# Patient Record
Sex: Female | Born: 1955 | Hispanic: Yes | Marital: Single | Smoking: Never smoker
Health system: Southern US, Community
[De-identification: ages and names within clinical notes are randomized; demographics above are authoritative.]

## PROBLEM LIST (undated history)

## (undated) DIAGNOSIS — Z789 Other specified health status: Secondary | ICD-10-CM

## (undated) DIAGNOSIS — R519 Headache, unspecified: Secondary | ICD-10-CM

---

## 2015-01-18 ENCOUNTER — Ambulatory Visit
Admission: RE | Admit: 2015-01-18 | Discharge: 2015-01-18 | Disposition: A | Discharge status: Home / Self Care | Payer: Self-pay | Source: Ambulatory Visit | Attending: Family Medicine | Admitting: Family Medicine

## 2015-01-18 ENCOUNTER — Other Ambulatory Visit: Payer: Self-pay | Admitting: Family Medicine

## 2015-01-18 DIAGNOSIS — M25561 Pain in right knee: Secondary | ICD-10-CM | POA: Insufficient documentation

## 2015-01-18 DIAGNOSIS — M25861 Other specified joint disorders, right knee: Secondary | ICD-10-CM | POA: Insufficient documentation

## 2015-01-18 DIAGNOSIS — M25461 Effusion, right knee: Secondary | ICD-10-CM | POA: Insufficient documentation

## 2020-08-01 ENCOUNTER — Other Ambulatory Visit: Payer: Self-pay | Admitting: Primary Care

## 2020-08-01 DIAGNOSIS — Z1231 Encounter for screening mammogram for malignant neoplasm of breast: Secondary | ICD-10-CM

## 2020-09-05 ENCOUNTER — Other Ambulatory Visit: Payer: Self-pay

## 2020-09-05 ENCOUNTER — Ambulatory Visit (INDEPENDENT_AMBULATORY_CARE_PROVIDER_SITE_OTHER): Payer: Self-pay | Admitting: Gastroenterology

## 2020-09-05 DIAGNOSIS — Z1211 Encounter for screening for malignant neoplasm of colon: Secondary | ICD-10-CM

## 2020-09-05 MED ORDER — NA SULFATE-K SULFATE-MG SULF 17.5-3.13-1.6 GM/177ML PO SOLN: 354.0000 mL | Freq: Once | ORAL | 0 refills | Status: AC

## 2020-09-05 NOTE — Progress Notes (Signed)
Gastroenterology Pre-Procedure Review  Request Date: 10/08/2020 Requesting Physician: Dr. Allegra Lai  PATIENT REVIEW QUESTIONS: The patient responded to the following health history questions as indicated:    1. Are you having any GI issues? no 2. Do you have a personal history of Polyps? no 3. Do you have a family history of Colon Cancer or Polyps? no 4. Diabetes Mellitus? no 5. Joint replacements in the past 12 months?no 6. Major health problems in the past 3 months?no 7. Any artificial heart valves, MVP, or defibrillator?no    MEDICATIONS & ALLERGIES:    Patient reports the following regarding taking any anticoagulation/antiplatelet therapy:   Plavix, Coumadin, Eliquis, Xarelto, Lovenox, Pradaxa, Brilinta, or Effient? no Aspirin? no  Patient confirms/reports the following medications:  Current Outpatient Medications  Medication Sig Dispense Refill  . Na Sulfate-K Sulfate-Mg Sulf 17.5-3.13-1.6 GM/177ML SOLN Take 354 mLs by mouth once for 1 dose. 354 mL 0   No current facility-administered medications for this visit.    Patient confirms/reports the following allergies:  Allergies  Allergen Reactions  . Penicillin G Itching    No orders of the defined types were placed in this encounter.   AUTHORIZATION INFORMATION Primary Insurance: 1D#: Group #:  Secondary Insurance: 1D#: Group #:  SCHEDULE INFORMATION: Date:  Time: Location:

## 2020-10-04 ENCOUNTER — Other Ambulatory Visit: Payer: Self-pay

## 2020-10-04 ENCOUNTER — Other Ambulatory Visit
Admission: RE | Admit: 2020-10-04 | Discharge: 2020-10-04 | Disposition: A | Discharge status: Home / Self Care | Payer: Medicare Other | Source: Ambulatory Visit | Attending: Gastroenterology | Admitting: Gastroenterology

## 2020-10-04 DIAGNOSIS — Z01812 Encounter for preprocedural laboratory examination: Secondary | ICD-10-CM | POA: Diagnosis present

## 2020-10-04 DIAGNOSIS — Z20822 Contact with and (suspected) exposure to covid-19: Secondary | ICD-10-CM | POA: Insufficient documentation

## 2020-10-05 ENCOUNTER — Encounter: Payer: Self-pay | Admitting: Gastroenterology

## 2020-10-05 LAB — SARS CORONAVIRUS 2 (TAT 6-24 HRS): SARS Coronavirus 2: NEGATIVE

## 2020-10-08 ENCOUNTER — Encounter
Admission: RE | Disposition: A | Discharge status: Home / Self Care | Payer: Self-pay | Source: Home / Self Care | Attending: Gastroenterology

## 2020-10-08 ENCOUNTER — Ambulatory Visit: Payer: Medicare Other | Admitting: Anesthesiology

## 2020-10-08 ENCOUNTER — Ambulatory Visit
Admission: RE | Admit: 2020-10-08 | Discharge: 2020-10-08 | Disposition: A | Discharge status: Home / Self Care | Payer: Medicare Other | Attending: Gastroenterology | Admitting: Gastroenterology

## 2020-10-08 ENCOUNTER — Encounter: Payer: Self-pay | Admitting: Gastroenterology

## 2020-10-08 ENCOUNTER — Other Ambulatory Visit: Payer: Self-pay

## 2020-10-08 DIAGNOSIS — K5730 Diverticulosis of large intestine without perforation or abscess without bleeding: Secondary | ICD-10-CM | POA: Diagnosis not present

## 2020-10-08 DIAGNOSIS — K621 Rectal polyp: Secondary | ICD-10-CM | POA: Insufficient documentation

## 2020-10-08 DIAGNOSIS — Z880 Allergy status to penicillin: Secondary | ICD-10-CM | POA: Diagnosis not present

## 2020-10-08 DIAGNOSIS — K6282 Dysplasia of anus: Secondary | ICD-10-CM | POA: Diagnosis not present

## 2020-10-08 DIAGNOSIS — Z1211 Encounter for screening for malignant neoplasm of colon: Secondary | ICD-10-CM

## 2020-10-08 DIAGNOSIS — K6289 Other specified diseases of anus and rectum: Secondary | ICD-10-CM | POA: Diagnosis not present

## 2020-10-08 HISTORY — PX: COLONOSCOPY WITH PROPOFOL: SHX5780

## 2020-10-08 HISTORY — DX: Other specified health status: Z78.9

## 2020-10-08 SURGERY — COLONOSCOPY WITH PROPOFOL
Anesthesia: General

## 2020-10-08 MED ORDER — LIDOCAINE HCL (PF) 2 % IJ SOLN
INTRAMUSCULAR | Status: AC
  Filled 2020-10-08: qty 5

## 2020-10-08 MED ORDER — PROPOFOL 10 MG/ML IV BOLUS
INTRAVENOUS | Status: DC | PRN
  Administered 2020-10-08: 60 mg via INTRAVENOUS

## 2020-10-08 MED ORDER — PROPOFOL 500 MG/50ML IV EMUL
INTRAVENOUS | Status: AC
  Filled 2020-10-08: qty 50

## 2020-10-08 MED ORDER — LIDOCAINE HCL (CARDIAC) PF 100 MG/5ML IV SOSY
PREFILLED_SYRINGE | INTRAVENOUS | Status: DC | PRN
  Administered 2020-10-08: 50 mg via INTRAVENOUS

## 2020-10-08 MED ORDER — SODIUM CHLORIDE 0.9 % IV SOLN: INTRAVENOUS | Status: DC

## 2020-10-08 MED ORDER — PROPOFOL 500 MG/50ML IV EMUL
INTRAVENOUS | Status: DC | PRN
  Administered 2020-10-08: 200 ug/kg/min via INTRAVENOUS

## 2020-10-08 NOTE — Progress Notes (Signed)
No risk

## 2020-10-08 NOTE — Anesthesia Postprocedure Evaluation (Signed)
Anesthesia Post Note  Patient: Mackenzie Bell  Procedure(s) Performed: COLONOSCOPY WITH PROPOFOL (N/A )  Patient location during evaluation: Endoscopy Anesthesia Type: General Level of consciousness: awake and alert Pain management: pain level controlled Vital Signs Assessment: post-procedure vital signs reviewed and stable Respiratory status: spontaneous breathing, nonlabored ventilation, respiratory function stable and patient connected to nasal cannula oxygen Cardiovascular status: blood pressure returned to baseline and stable Postop Assessment: no apparent nausea or vomiting Anesthetic complications: no   No complications documented.   Last Vitals:  Vitals:   10/08/20 0932 10/08/20 0940  BP: 117/66 116/74  Pulse: 64 60  Resp: 15 15  Temp:    SpO2: 99% 99%    Last Pain:  Vitals:   10/08/20 0940  TempSrc:   PainSc: 0-No pain                 Corinda Gubler

## 2020-10-08 NOTE — Anesthesia Preprocedure Evaluation (Signed)
Anesthesia Evaluation  Patient identified by MRN, date of birth, ID band Patient awake    Reviewed: Allergy & Precautions, NPO status , Patient's Chart, lab work & pertinent test results  History of Anesthesia Complications Negative for: history of anesthetic complications  Airway Mallampati: II  TM Distance: >3 FB Neck ROM: Full    Dental no notable dental hx. (+) Teeth Intact   Pulmonary neg pulmonary ROS, neg sleep apnea, neg COPD, Patient abstained from smoking.Not current smoker,    Pulmonary exam normal breath sounds clear to auscultation       Cardiovascular Exercise Tolerance: Good METS(-) hypertension(-) CAD and (-) Past MI negative cardio ROS  (-) dysrhythmias  Rhythm:Regular Rate:Normal - Systolic murmurs    Neuro/Psych negative neurological ROS  negative psych ROS   GI/Hepatic neg GERD  ,(+)     (-) substance abuse  ,   Endo/Other  neg diabetes  Renal/GU negative Renal ROS     Musculoskeletal   Abdominal   Peds  Hematology   Anesthesia Other Findings Past Medical History: No date: Medical history non-contributory  Reproductive/Obstetrics                             Anesthesia Physical Anesthesia Plan  ASA: II  Anesthesia Plan: General   Post-op Pain Management:    Induction: Intravenous  PONV Risk Score and Plan: 3 and Ondansetron, Propofol infusion and TIVA  Airway Management Planned: Nasal Cannula  Additional Equipment: None  Intra-op Plan:   Post-operative Plan:   Informed Consent: I have reviewed the patients History and Physical, chart, labs and discussed the procedure including the risks, benefits and alternatives for the proposed anesthesia with the patient or authorized representative who has indicated his/her understanding and acceptance.     Dental advisory given and Interpreter used for interveiw (Engineering geologist at bedside)  Plan  Discussed with: CRNA and Surgeon  Anesthesia Plan Comments: (Discussed risks of anesthesia with patient, including possibility of difficulty with spontaneous ventilation under anesthesia necessitating airway intervention, PONV, and rare risks such as cardiac or respiratory or neurological events. Patient understands.)        Anesthesia Quick Evaluation

## 2020-10-08 NOTE — OR Nursing (Signed)
Marittiza here to interpret spanish for pt.

## 2020-10-08 NOTE — Op Note (Signed)
[facility] Gastroenterology Patient Name: Mackenzie Bell Procedure Date: 10/08/2020 8:41 AM MRN: [id number] Account #: [id number] Date of Birth: Apr 11, 1956 Admit Type: Outpatient Age: 65 Room: [facility] ENDO ROOM 2 Gender: Female Note Status: Finalized Procedure:             Colonoscopy Indications:           Screening for colorectal malignant neoplasm, This is                         the patient's first colonoscopy Providers:             Toney Reil MD, MD Referring MD:          Dorcas Mcmurray (Referring MD) Medicines:             General Anesthesia Complications:         No immediate complications. Estimated blood loss:                         Minimal. Procedure:             Pre-Anesthesia Assessment:                        - Prior to the procedure, a History and Physical was                         performed, and patient medications and allergies were                         reviewed. The patient is competent. The risks and                         benefits of the procedure and the sedation options and                         risks were discussed with the patient. All questions                         were answered and informed consent was obtained.                         Patient identification and proposed procedure were                         verified by the physician, the nurse, the                         anesthesiologist, the anesthetist and the technician                         in the pre-procedure area in the procedure room in the                         endoscopy suite. Mental Status Examination: alert and                         oriented. Airway Examination: normal oropharyngeal  airway and neck mobility. Respiratory Examination:                         clear to auscultation. CV Examination: normal.                         Prophylactic Antibiotics: The patient does not require                         prophylactic  antibiotics. Prior Anticoagulants: The                         patient has taken no previous anticoagulant or                         antiplatelet agents. ASA Grade Assessment: II - A                         patient with mild systemic disease. After reviewing                         the risks and benefits, the patient was deemed in                         satisfactory condition to undergo the procedure. The                         anesthesia plan was to use general anesthesia.                         Immediately prior to administration of medications,                         the patient was re-assessed for adequacy to receive                         sedatives. The heart rate, respiratory rate, oxygen                         saturations, blood pressure, adequacy of pulmonary                         ventilation, and response to care were monitored                         throughout the procedure. The physical status of the                         patient was re-assessed after the procedure.                        After obtaining informed consent, the colonoscope was                         passed under direct vision. Throughout the procedure,                         the patient's blood pressure, pulse, and oxygen  saturations were monitored continuously. The                         Colonoscope was introduced through the anus and                         advanced to the the terminal ileum, with                         identification of the appendiceal orifice and IC                         valve. The colonoscopy was performed without                         difficulty. The patient tolerated the procedure well.                         The quality of the bowel preparation was evaluated                         using the BBPS [facility] Bowel Preparation Scale) with                         scores of: Right Colon = 3, Transverse Colon = 3 and                         Left Colon =  3 (entire mucosa seen well with no                         residual staining, small fragments of stool or opaque                         liquid). The total BBPS score equals 9. Findings:      The perianal and digital rectal examinations were normal. Pertinent       negatives include normal sphincter tone and no palpable rectal lesions.      The terminal ileum appeared normal.      A few diverticula were found in the sigmoid colon.      A diminutive polyp was found in the distal rectum. The polyp was       sessile. The polyp was removed with a cold snare. Resection and       retrieval were complete.      A localized area of granular mucosa was found in the anal canal abutting       the dentate line. Biopsies were taken with a cold forceps for histology.       Estimated blood loss was minimal. Impression:            - The examined portion of the ileum was normal.                        - Diverticulosis in the sigmoid colon.                        - One diminutive polyp in the distal rectum, removed  with a cold snare. Resected and retrieved.                        - Granularity at the anus. Biopsied. Recommendation:        - Discharge patient to home (with escort).                        - Resume previous diet today.                        - Continue present medications.                        - Await pathology results.                        - Repeat colonoscopy in 7 years for surveillance based                         on pathology results. Procedure Code(s):     --- Professional ---                        [phone], Colonoscopy, flexible; with removal of                         tumor(s), polyp(s), or other lesion(s) by snare                         technique                        45380, 59, Colonoscopy, flexible; with biopsy, single                         or multiple Diagnosis Code(s):     --- Professional ---                        Z12.11, Encounter for screening  for malignant neoplasm                         of colon                        K62.1, Rectal polyp                        K62.89, Other specified diseases of anus and rectum                        K57.30, Diverticulosis of large intestine without                         perforation or abscess without bleeding CPT copyright 2019 American Medical Association. All rights reserved. The codes documented in this report are preliminary and upon coder review may  be revised to meet current compliance requirements. Dr. Libby Maw Toney Reil MD, MD 10/08/2020 9:12:06 AM This report has been signed electronically. Number of Addenda: 0 Note Initiated On: 10/08/2020 8:41 AM Scope Withdrawal Time: 0 hours 10 minutes 55 seconds  Total Procedure Duration: 0 hours 13 minutes 38 seconds  Estimated Blood Loss:  Estimated blood loss was minimal.      [facility]

## 2020-10-08 NOTE — Transfer of Care (Signed)
Immediate Anesthesia Transfer of Care Note  Patient: Mackenzie Bell  Procedure(s) Performed: COLONOSCOPY WITH PROPOFOL (N/A )  Patient Location: PACU and Endoscopy Unit  Anesthesia Type:General  Level of Consciousness: drowsy and patient cooperative  Airway & Oxygen Therapy: Patient Spontanous Breathing  Post-op Assessment: Report given to RN  Post vital signs: Reviewed and stable  Last Vitals:  Vitals Value Taken Time  BP 93/51 10/08/20 0912  Temp 35.9 C 10/08/20 0912  Pulse 76 10/08/20 0912  Resp 12 10/08/20 0912  SpO2 93 % 10/08/20 0912    Last Pain:  Vitals:   10/08/20 0912  TempSrc: Temporal  PainSc: Asleep         Complications: No complications documented.

## 2020-10-08 NOTE — H&P (Signed)
  Mackenzie Repress, MD [address]  [address]  [place], [place] [address]  Main: [phone]  Fax: [phone] Pager: [phone]  Primary Care Physician:  Sandrea Hughs, NP Primary Gastroenterologist:  Dr. Arlyss Bell  Pre-Procedure History & Physical: HPI:  Mackenzie Bell is a 65 y.o. female is here for an colonoscopy.   Past Medical History:  Diagnosis Date  . Medical history non-contributory     History reviewed. No pertinent surgical history.  Prior to Admission medications   Not on File    Allergies as of 09/05/2020 - never reviewed  Allergen Reaction Noted  . Penicillin g Itching 07/21/2018    History reviewed. No pertinent family history.  Social History   Socioeconomic History  . Marital status: Single    Spouse name: Not on file  . Number of children: Not on file  . Years of education: Not on file  . Highest education level: Not on file  Occupational History  . Not on file  Tobacco Use  . Smoking status: Never Smoker  . Smokeless tobacco: Never Used  Substance and Sexual Activity  . Alcohol use: Not on file    Comment: occasional beer  . Drug use: Never  . Sexual activity: Not on file  Other Topics Concern  . Not on file  Social History Narrative  . Not on file   Social Determinants of Health   Financial Resource Strain: Not on file  Food Insecurity: Not on file  Transportation Needs: Not on file  Physical Activity: Not on file  Stress: Not on file  Social Connections: Not on file  Intimate Partner Violence: Not on file    Review of Systems: See HPI, otherwise negative ROS  Physical Exam: Vitals:   10/08/20 0816  BP: 127/67  Pulse: 66  Resp: 18  Temp: (!) 97.1 F (36.2 C)  SpO2: 99%    General:   Alert,  pleasant and cooperative in NAD Head:  Normocephalic and atraumatic. Neck:  Supple; no masses or thyromegaly. Lungs:  Clear throughout to auscultation.    Heart:  Regular rate and rhythm. Abdomen:  Soft,  nontender and nondistended. Normal bowel sounds, without guarding, and without rebound.   Neurologic:  Alert and  oriented x4;  grossly normal neurologically.  Impression/Plan: Mackenzie Bell is here for an colonoscopy to be performed for colon cancer screening  Risks, benefits, limitations, and alternatives regarding  colonoscopy have been reviewed with the patient.  Questions have been answered.  All parties agreeable.   Lannette Donath, MD  10/08/2020, 8:37 AM

## 2020-10-09 ENCOUNTER — Encounter: Payer: Self-pay | Admitting: Gastroenterology

## 2020-10-09 LAB — SURGICAL PATHOLOGY

## 2020-10-15 ENCOUNTER — Telehealth: Payer: Self-pay

## 2020-10-15 NOTE — Telephone Encounter (Signed)
-----   Message from Toney Reil, MD sent at 10/15/2020  3:24 PM EDT ----- Called patient to discuss about the abnormal pathology results of the anal canal lesion and I reviewed case with colorectal surgeon, Dr. Marin Olp.  Explained the results to the patient via Spanish interpreter service over telephone and she agreed to be referred to the colorectal surgery group in [place], [place] [facility]  Morrie Sheldon Please put in a referral to [place] [facility] Dx: HGSIL, anal canal lesion   Mackenzie Bell

## 2020-10-15 NOTE — Progress Notes (Signed)
Called patient to discuss about the abnormal pathology results of the anal canal lesion and I reviewed case with colorectal surgeon, Dr. Marin Olp.  Explained the results to the patient via Spanish interpreter service over telephone and she agreed to be referred to the colorectal surgery group in [place], [place] [facility]  Morrie Sheldon Please put in a referral to [place] [facility] Dx: HGSIL, anal canal lesion   Mackenzie Bell

## 2020-10-15 NOTE — Telephone Encounter (Signed)
Faxed referral to [place]. Attached snap shot, insurance and notes, labs .

## 2020-12-03 ENCOUNTER — Ambulatory Visit: Payer: Self-pay | Admitting: General Surgery

## 2021-02-07 ENCOUNTER — Encounter (HOSPITAL_BASED_OUTPATIENT_CLINIC_OR_DEPARTMENT_OTHER): Payer: Self-pay | Admitting: General Surgery

## 2021-02-07 DIAGNOSIS — K6282 Dysplasia of anus: Secondary | ICD-10-CM

## 2021-02-07 HISTORY — DX: Dysplasia of anus: K62.82

## 2021-02-11 ENCOUNTER — Encounter (HOSPITAL_BASED_OUTPATIENT_CLINIC_OR_DEPARTMENT_OTHER): Payer: Self-pay | Admitting: General Surgery

## 2021-02-11 ENCOUNTER — Other Ambulatory Visit: Payer: Self-pay

## 2021-02-11 NOTE — Progress Notes (Signed)
Spoke w/ via phone for pre-op interview---pt with pacific interpreter johair number [phone] Lab needs dos----  none            Lab results------none COVID test -----patient states asymptomatic no test needed Arrive at -------530 am 02-14-2021 NPO after MN NO Solid Food or liquids Med rec completed Medications to take morning of surgery -----none Diabetic medication -----n/a Patient instructed no nail polish to be worn day of surgery Patient instructed to bring photo id and insurance card day of surgery Patient aware to have Driver (ride ) / caregiver  daughter Neita Goodnight mingo to stay  for 24 hours after surgery  Patient Special Instructions -----none Pre-Op special Istructions -----none Patient verbalized understanding of instructions that were given at this phone interview. Patient denies shortness of breath, chest pain, fever, cough at this phone interview.   Spanish interpreter requested, no gender preference per patient, email requested placed on patient chart.

## 2021-02-13 NOTE — Anesthesia Preprocedure Evaluation (Addendum)
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  Reproductive/Obstetrics                            Anesthesia Physical Anesthesia Plan  ASA: 2  Anesthesia Plan: MAC   Post-op Pain Management:    Induction:   PONV Risk Score and Plan: 2 and Treatment may vary due to age or medical condition, Midazolam, Ondansetron, Propofol infusion and TIVA  Airway Management Planned: Natural Airway and Simple Face Mask  Additional Equipment: None  Intra-op Plan:   Post-operative Plan:   Informed Consent: I have reviewed the patients History and Physical, chart, labs and discussed the procedure including the risks, benefits and alternatives for the proposed anesthesia with the patient or authorized representative who has indicated his/her understanding and acceptance.       Plan Discussed with: CRNA  Anesthesia Plan Comments:        Anesthesia Quick Evaluation

## 2021-02-14 ENCOUNTER — Other Ambulatory Visit: Payer: Self-pay

## 2021-02-14 ENCOUNTER — Ambulatory Visit (HOSPITAL_BASED_OUTPATIENT_CLINIC_OR_DEPARTMENT_OTHER): Payer: Medicare HMO | Admitting: Anesthesiology

## 2021-02-14 ENCOUNTER — Encounter (HOSPITAL_BASED_OUTPATIENT_CLINIC_OR_DEPARTMENT_OTHER)
Admission: RE | Disposition: A | Discharge status: Home / Self Care | Payer: Self-pay | Source: Home / Self Care | Attending: General Surgery

## 2021-02-14 ENCOUNTER — Encounter (HOSPITAL_BASED_OUTPATIENT_CLINIC_OR_DEPARTMENT_OTHER): Payer: Self-pay | Admitting: General Surgery

## 2021-02-14 ENCOUNTER — Ambulatory Visit (HOSPITAL_BASED_OUTPATIENT_CLINIC_OR_DEPARTMENT_OTHER)
Admission: RE | Admit: 2021-02-14 | Discharge: 2021-02-14 | Disposition: A | Discharge status: Home / Self Care | Payer: Medicare HMO | Attending: General Surgery | Admitting: General Surgery

## 2021-02-14 DIAGNOSIS — A630 Anogenital (venereal) warts: Secondary | ICD-10-CM | POA: Diagnosis not present

## 2021-02-14 DIAGNOSIS — K6282 Dysplasia of anus: Secondary | ICD-10-CM | POA: Insufficient documentation

## 2021-02-14 HISTORY — PX: RECTAL EXAM UNDER ANESTHESIA: SHX6399

## 2021-02-14 HISTORY — PX: MASS EXCISION: SHX2000

## 2021-02-14 HISTORY — DX: Headache, unspecified: R51.9

## 2021-02-14 SURGERY — EXCISION MASS
Anesthesia: Monitor Anesthesia Care | Site: Rectum

## 2021-02-14 MED ORDER — SODIUM CHLORIDE 0.9 % IV SOLN: 250.0000 mL | INTRAVENOUS | Status: DC | PRN

## 2021-02-14 MED ORDER — DEXAMETHASONE SODIUM PHOSPHATE 4 MG/ML IJ SOLN
INTRAMUSCULAR | Status: DC | PRN
Start: 2021-02-14 — End: 2021-02-14
  Administered 2021-02-14: 5 mg via INTRAVENOUS

## 2021-02-14 MED ORDER — SODIUM CHLORIDE 0.9% FLUSH: 3.0000 mL | INTRAVENOUS | Status: DC | PRN

## 2021-02-14 MED ORDER — PROPOFOL 1000 MG/100ML IV EMUL
INTRAVENOUS | Status: AC
  Filled 2021-02-14: qty 100

## 2021-02-14 MED ORDER — FENTANYL CITRATE (PF) 100 MCG/2ML IJ SOLN: 25.0000 ug | INTRAMUSCULAR | Status: DC | PRN

## 2021-02-14 MED ORDER — SODIUM CHLORIDE 0.9% FLUSH: 3.0000 mL | Freq: Two times a day (BID) | INTRAVENOUS | Status: DC

## 2021-02-14 MED ORDER — 0.9 % SODIUM CHLORIDE (POUR BTL) OPTIME
TOPICAL | Status: DC | PRN
  Administered 2021-02-14: 500 mL

## 2021-02-14 MED ORDER — ONDANSETRON HCL 4 MG/2ML IJ SOLN
INTRAMUSCULAR | Status: AC
  Filled 2021-02-14: qty 2

## 2021-02-14 MED ORDER — MIDAZOLAM HCL 5 MG/5ML IJ SOLN
INTRAMUSCULAR | Status: DC | PRN
  Administered 2021-02-14: 1 mg via INTRAVENOUS

## 2021-02-14 MED ORDER — LACTATED RINGERS IV SOLN: INTRAVENOUS | Status: DC

## 2021-02-14 MED ORDER — ACETAMINOPHEN 500 MG PO TABS
ORAL_TABLET | ORAL | Status: AC
  Filled 2021-02-14: qty 2

## 2021-02-14 MED ORDER — TRAMADOL HCL 50 MG PO TABS: 50.0000 mg | ORAL_TABLET | Freq: Four times a day (QID) | ORAL | 0 refills | Status: DC | PRN

## 2021-02-14 MED ORDER — PROPOFOL 1000 MG/100ML IV EMUL
INTRAVENOUS | Status: AC
  Filled 2021-02-14: qty 200

## 2021-02-14 MED ORDER — KETOROLAC TROMETHAMINE 30 MG/ML IJ SOLN
INTRAMUSCULAR | Status: AC
  Filled 2021-02-14: qty 1

## 2021-02-14 MED ORDER — ACETAMINOPHEN 325 MG PO TABS: 650.0000 mg | ORAL_TABLET | ORAL | Status: DC | PRN

## 2021-02-14 MED ORDER — BUPIVACAINE-EPINEPHRINE 0.5% -1:200000 IJ SOLN
INTRAMUSCULAR | Status: DC | PRN
  Administered 2021-02-14: 30 mL

## 2021-02-14 MED ORDER — PROPOFOL 500 MG/50ML IV EMUL
INTRAVENOUS | Status: DC | PRN
  Administered 2021-02-14: 100 ug/kg/min via INTRAVENOUS

## 2021-02-14 MED ORDER — KETOROLAC TROMETHAMINE 30 MG/ML IJ SOLN
INTRAMUSCULAR | Status: DC | PRN
  Administered 2021-02-14: 30 mg via INTRAVENOUS

## 2021-02-14 MED ORDER — FENTANYL CITRATE (PF) 100 MCG/2ML IJ SOLN
INTRAMUSCULAR | Status: AC
  Filled 2021-02-14: qty 2

## 2021-02-14 MED ORDER — ACETAMINOPHEN 500 MG PO TABS
1000.0000 mg | ORAL_TABLET | Freq: Once | ORAL | Status: AC
  Administered 2021-02-14: 1000 mg via ORAL

## 2021-02-14 MED ORDER — LIDOCAINE HCL (CARDIAC) PF 100 MG/5ML IV SOSY
PREFILLED_SYRINGE | INTRAVENOUS | Status: DC | PRN
  Administered 2021-02-14: 40 mg via INTRAVENOUS

## 2021-02-14 MED ORDER — OXYCODONE HCL 5 MG PO TABS: 5.0000 mg | ORAL_TABLET | Freq: Once | ORAL | Status: DC | PRN

## 2021-02-14 MED ORDER — ACETAMINOPHEN 325 MG RE SUPP: 650.0000 mg | RECTAL | Status: DC | PRN

## 2021-02-14 MED ORDER — MIDAZOLAM HCL 2 MG/2ML IJ SOLN
INTRAMUSCULAR | Status: AC
  Filled 2021-02-14: qty 2

## 2021-02-14 MED ORDER — OXYCODONE HCL 5 MG PO TABS: 5.0000 mg | ORAL_TABLET | ORAL | Status: DC | PRN

## 2021-02-14 MED ORDER — PROPOFOL 500 MG/50ML IV EMUL
INTRAVENOUS | Status: AC
  Filled 2021-02-14: qty 50

## 2021-02-14 MED ORDER — ONDANSETRON HCL 4 MG/2ML IJ SOLN
INTRAMUSCULAR | Status: DC | PRN
  Administered 2021-02-14: 4 mg via INTRAVENOUS

## 2021-02-14 MED ORDER — OXYCODONE HCL 5 MG/5ML PO SOLN: 5.0000 mg | Freq: Once | ORAL | Status: DC | PRN

## 2021-02-14 MED ORDER — LIDOCAINE HCL (PF) 2 % IJ SOLN
INTRAMUSCULAR | Status: AC
  Filled 2021-02-14: qty 5

## 2021-02-14 MED ORDER — DEXAMETHASONE SODIUM PHOSPHATE 10 MG/ML IJ SOLN
INTRAMUSCULAR | Status: AC
  Filled 2021-02-14: qty 1

## 2021-02-14 SURGICAL SUPPLY — 56 items
APL SKNCLS STERI-STRIP NONHPOA (GAUZE/BANDAGES/DRESSINGS) ×2
BENZOIN TINCTURE PRP APPL 2/3 (GAUZE/BANDAGES/DRESSINGS) ×4 IMPLANT
BLADE EXTENDED COATED 6.5IN (ELECTRODE) IMPLANT
BLADE HEX COATED 2.75 (ELECTRODE) ×2 IMPLANT
BLADE SURG 10 STRL SS (BLADE) ×2 IMPLANT
COVER BACK TABLE 60X90IN (DRAPES) ×2 IMPLANT
COVER MAYO STAND STRL (DRAPES) ×2 IMPLANT
DECANTER SPIKE VIAL GLASS SM (MISCELLANEOUS) ×2 IMPLANT
DRAPE HYSTEROSCOPY (MISCELLANEOUS) IMPLANT
DRAPE LAPAROTOMY 100X72 PEDS (DRAPES) ×2 IMPLANT
DRAPE SHEET LG 3/4 BI-LAMINATE (DRAPES) IMPLANT
DRAPE UTILITY XL STRL (DRAPES) ×2 IMPLANT
DRSG PAD ABDOMINAL 8X10 ST (GAUZE/BANDAGES/DRESSINGS) ×2 IMPLANT
ELECT REM PT RETURN 9FT ADLT (ELECTROSURGICAL) ×2
ELECTRODE REM PT RTRN 9FT ADLT (ELECTROSURGICAL) ×1 IMPLANT
GAUZE 4X4 16PLY ~~LOC~~+RFID DBL (SPONGE) ×2 IMPLANT
GAUZE SPONGE 4X4 12PLY STRL (GAUZE/BANDAGES/DRESSINGS) ×2 IMPLANT
GAUZE SPONGE 4X4 12PLY STRL LF (GAUZE/BANDAGES/DRESSINGS) ×2 IMPLANT
GLOVE SURG ENC MOIS LTX SZ6.5 (GLOVE) ×2 IMPLANT
GLOVE SURG UNDER POLY LF SZ7 (GLOVE) ×2 IMPLANT
GOWN STRL REUS W/TWL XL LVL3 (GOWN DISPOSABLE) ×2 IMPLANT
HYDROGEN PEROXIDE 16OZ (MISCELLANEOUS) ×2 IMPLANT
IV CATH 14GX2 1/4 (CATHETERS) ×2 IMPLANT
IV CATH 18G SAFETY (IV SOLUTION) ×2 IMPLANT
KIT SIGMOIDOSCOPE (SET/KITS/TRAYS/PACK) IMPLANT
KIT TURNOVER CYSTO (KITS) ×2 IMPLANT
LEGGING LITHOTOMY PAIR STRL (DRAPES) IMPLANT
LOOP VESSEL MAXI BLUE (MISCELLANEOUS) IMPLANT
NDL SAFETY ECLIPSE 18X1.5 (NEEDLE) IMPLANT
NEEDLE HYPO 18GX1.5 SHARP (NEEDLE)
NEEDLE HYPO 22GX1.5 SAFETY (NEEDLE) ×2 IMPLANT
NS IRRIG 500ML POUR BTL (IV SOLUTION) ×2 IMPLANT
PACK BASIN DAY SURGERY FS (CUSTOM PROCEDURE TRAY) ×2 IMPLANT
PAD ARMBOARD 7.5X6 YLW CONV (MISCELLANEOUS) IMPLANT
PANTS MESH DISP LRG (UNDERPADS AND DIAPERS) ×1 IMPLANT
PANTS MESH DISPOSABLE L (UNDERPADS AND DIAPERS) ×1
PENCIL SMOKE EVACUATOR (MISCELLANEOUS) ×2 IMPLANT
SPONGE HEMORRHOID 8X3CM (HEMOSTASIS) IMPLANT
SPONGE SURGIFOAM ABS GEL 12-7 (HEMOSTASIS) IMPLANT
SUCTION FRAZIER HANDLE 10FR (MISCELLANEOUS)
SUCTION TUBE FRAZIER 10FR DISP (MISCELLANEOUS) IMPLANT
SUT CHROMIC 2 0 SH (SUTURE) ×2 IMPLANT
SUT CHROMIC 3 0 SH 27 (SUTURE) ×2 IMPLANT
SUT ETHIBOND 0 (SUTURE) IMPLANT
SUT MON AB 3-0 SH 27 (SUTURE) ×2
SUT MON AB 3-0 SH27 (SUTURE) ×1 IMPLANT
SUT VIC AB 2-0 SH 27 (SUTURE)
SUT VIC AB 2-0 SH 27XBRD (SUTURE) IMPLANT
SUT VIC AB 3-0 SH 18 (SUTURE) IMPLANT
SUT VIC AB 3-0 SH 27 (SUTURE)
SUT VIC AB 3-0 SH 27XBRD (SUTURE) IMPLANT
SYR CONTROL 10ML LL (SYRINGE) ×2 IMPLANT
TOWEL OR 17X26 10 PK STRL BLUE (TOWEL DISPOSABLE) ×2 IMPLANT
TRAY DSU PREP LF (CUSTOM PROCEDURE TRAY) ×2 IMPLANT
TUBE CONNECTING 12X1/4 (SUCTIONS) ×2 IMPLANT
YANKAUER SUCT BULB TIP NO VENT (SUCTIONS) ×2 IMPLANT

## 2021-02-14 NOTE — Anesthesia Postprocedure Evaluation (Signed)
Anesthesia Post Note  Patient: Mackenzie Bell  Procedure(s) Performed: EXCISIONAL BIOPSY OF ANAL CANAL LESION (Rectum) RECTAL EXAM UNDER ANESTHESIA (Rectum)     Patient location during evaluation: PACU Anesthesia Type: MAC Level of consciousness: awake and alert and oriented Pain management: pain level controlled Vital Signs Assessment: post-procedure vital signs reviewed and stable Respiratory status: spontaneous breathing, nonlabored ventilation and respiratory function stable Cardiovascular status: blood pressure returned to baseline Postop Assessment: no apparent nausea or vomiting Anesthetic complications: no   No notable events documented.  Last Vitals:  Vitals:   02/14/21 0812 02/14/21 0815  BP: (!) 144/71 (!) 144/72  Pulse: 86 90  Resp: 15 (!) 22  Temp: (!) 36.4 C   SpO2: 96% 95%    Last Pain:  Vitals:   02/14/21 0812  TempSrc:   PainSc: 0-No pain                 Marthenia Rolling

## 2021-02-14 NOTE — Discharge Instructions (Addendum)
Beginning the day after surgery:  You may sit in a tub of warm water 2-3 times a day to relieve discomfort.  Eat a regular diet high in fiber.  Avoid foods that give you constipation or diarrhea.  Avoid foods that are difficult to digest, such as seeds, nuts, corn or popcorn.  Do not go any longer than 2 days without a bowel movement.  You may take a dose of Milk of Magnesia if you become constipated.    Drink 6-8 glasses of water daily.  Walking is encouraged.  Avoid strenuous activity and heavy lifting for one month after surgery.    Call the office if you have any questions or concerns.  Call immediately if you develop:  Excessive rectal bleeding (more than a cup or passing large clots) Increased discomfort Fever greater than 100 F Difficulty urinating    Post Anesthesia Home Care Instructions  Activity: Get plenty of rest for the remainder of the day. A responsible individual must stay with you for 24 hours following the procedure.  For the next 24 hours, DO NOT: -Drive a car -Advertising copywriter -Drink alcoholic beverages -Take any medication unless instructed by your physician -Make any legal decisions or sign important papers.  Meals: Start with liquid foods such as gelatin or soup. Progress to regular foods as tolerated. Avoid greasy, spicy, heavy foods. If nausea and/or vomiting occur, drink only clear liquids until the nausea and/or vomiting subsides. Call your physician if vomiting continues.  Special Instructions/Symptoms: Your throat may feel dry or sore from the anesthesia or the breathing tube placed in your throat during surgery. If this causes discomfort, gargle with warm salt water. The discomfort should disappear within 24 hours.  No ibuprofen, Advil, Aleve, Motrin, ketorolac, meloxicam, or naproxen until after 2 pm today if needed.

## 2021-02-14 NOTE — Addendum Note (Signed)
Addendum  created 02/14/21 0906 by Jessica Priest, CRNA   Charge Capture section accepted

## 2021-02-14 NOTE — H&P (Addendum)
The patient is a 65 year old female who presents with anal lesions. 65 year old female who presents to the office for evaluation of an anal lesion seen on recent colonoscopy.  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  She is asymptomatic.  She speaks Spanish only, and the encounter was conducted through an interpreter.   Diagnostic Studies History (Charmella Little, CNA; 12/03/2020 10:14 AM) Colonoscopy   1-5 years ago Mammogram   1-3 years ago  Allergies (Charmella Little, CNA; 12/03/2020 10:15 AM) No Known Drug Allergies   [12/03/2020]: Allergies Reconciled    Medication History (Charmella Little, CNA; 12/03/2020 10:15 AM) Medications Reconciled   Social History (Charmella Little, CNA; 12/03/2020 10:14 AM) Caffeine use   Coffee. No alcohol use   No drug use   Tobacco use   Never smoker.  Family History (Charmella Little, CNA; 12/03/2020 10:14 AM) First Degree Relatives   No pertinent family history    Pregnancy / Birth History (Charmella Little, CNA; 12/03/2020 10:14 AM) Age at menarche   13 years. Age of menopause   83-50 Gravida   3 Length (months) of breastfeeding   12-24 Maternal age   32-20  Other Problems (Charmella Little, CNA; 12/03/2020 10:14 AM) No pertinent past medical history      Review of Systems General Not Present- Appetite Loss, Chills, Fatigue, Fever, Night Sweats, Weight Gain and Weight Loss. Skin Not Present- Change in Wart/Mole, Dryness, Hives, Jaundice, New Lesions, Non-Healing Wounds, Rash and Ulcer. HEENT Present- Wears glasses/contact lenses. Not Present- Earache, Hearing Loss, Hoarseness, Nose Bleed, Oral Ulcers, Ringing in the Ears, Seasonal Allergies, Sinus Pain, Sore Throat, Visual Disturbances and Yellow Eyes. Respiratory Not Present- Bloody sputum, Chronic Cough, Difficulty Breathing, Snoring and Wheezing. Cardiovascular Not Present- Chest Pain, Difficulty Breathing Lying Down, Leg Cramps, Palpitations, Rapid Heart Rate, Shortness of Breath and  Swelling of Extremities. Female Genitourinary Not Present- Frequency, Nocturia, Painful Urination, Pelvic Pain and Urgency. Musculoskeletal Not Present- Back Pain, Joint Pain, Joint Stiffness, Muscle Pain, Muscle Weakness and Swelling of Extremities. Neurological Not Present- Decreased Memory, Fainting, Headaches, Numbness, Seizures, Tingling, Tremor, Trouble walking and Weakness. Psychiatric Not Present- Anxiety, Bipolar, Change in Sleep Pattern, Depression, Fearful and Frequent crying. Hematology Not Present- Blood Thinners, Easy Bruising, Excessive bleeding, Gland problems, HIV and Persistent Infections.  BP (!) 152/68   Pulse 68   Temp 98.5 F (36.9 C) (Oral)   Resp 15   Ht 4\' 11"  (1.499 m)   Wt 78.6 kg   SpO2 99%   BMI 34.98 kg/m      Physical Exam  General Mental Status - Alert. General Appearance - Cooperative. CV:RRR Lungs: CTA Abd: soft Rectal Anorectal Exam External - normal external exam. Internal - normal sphincter tone.    Procedure   Other: Procedure: Anoscopy.... Marland KitchenSurgeon: Marland Kitchen....Maisie FusMarland KitchenAfter the risks and benefits were explained, verbal consent was obtained for above procedure.  A medical assistant chaperone was present thoroughout the entire procedure. 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 During this, a lesion was noted in the anal canal. 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 I have recommended exam under anesthesia with excision for complete biopsy and removal. We have discussed this in detail via an interpreter. All questions were answered. We discussed typical recovery time, post op pain and bleeding and need for additional follow-up, depending on pathology.

## 2021-02-14 NOTE — Transfer of Care (Signed)
Immediate Anesthesia Transfer of Care Note  Patient: Mackenzie Bell  Procedure(s) Performed: Procedure(s) (LRB): EXCISIONAL BIOPSY OF ANAL CANAL LESION (N/A) RECTAL EXAM UNDER ANESTHESIA (N/A)  Patient Location: PACU  Anesthesia Type: MAC  Level of Consciousness: awake, sedated, patient cooperative and responds to stimulation  Airway & Oxygen Therapy: Patient Spontanous Breathing and Patient connected to RA with soft FM   Post-op Assessment: Report given to PACU RN, Post -op Vital signs reviewed and stable and Patient moving all extremities  Post vital signs: Reviewed and stable  Complications: No apparent anesthesia complications

## 2021-02-14 NOTE — Op Note (Signed)
02/14/2021  8:05 AM  PATIENT:  Mackenzie Bell  65 y.o. female  Patient Care Team: Freddy Finner, NP as PCP - General (Nurse Practitioner)  PRE-OPERATIVE DIAGNOSIS:  ANAL CANAL AIN GRADE 2  POST-OPERATIVE DIAGNOSIS:  ANAL CANAL AIN GRADE 2  PROCEDURE:   EXCISIONAL BIOPSY OF ANAL CANAL LESION RECTAL EXAM UNDER ANESTHESIA    Surgeon(s): Leighton Ruff, MD  ASSISTANT: none   ANESTHESIA:   local and MAC  SPECIMEN:  Source of Specimen:  anterior anal canal mass  DISPOSITION OF SPECIMEN:  PATHOLOGY  COUNTS:  YES  PLAN OF CARE: Discharge to home after PACU  PATIENT DISPOSITION:  PACU - hemodynamically stable.  INDICATION: 65 y.o. F with anterior anal canal mass   OR FINDINGS: ~68m anal lesion proximal to dentate line  DESCRIPTION: the patient was identified in the preoperative holding area and taken to the OR where they were laid on the operating room table.  MAC anesthesia was induced without difficulty. The patient was then positioned in prone jackknife position with buttocks gently taped apart.  The patient was then prepped and draped in usual sterile fashion.  SCDs were noted to be in place prior to the initiation of anesthesia. A surgical timeout was performed indicating the correct patient, procedure, positioning and need for preoperative antibiotics.  A rectal block was performed using Marcaine with epinephrine.    I began with a digital rectal exam.  The mass was noted at anterior midline.  I then placed a Hill-Ferguson anoscope into the anal canal and evaluated this completely.  There were no other lesions noted.  I incised the mass with margins using scissors.  The anoderm was closed using a 3-0 Chromic suture.  Additional Marcaine was applied.  Hemostasis was good.  A dressing was applied and the patient was awakened from anesthesia and sent to the PACU in stable condition.  All counts were correct per OR staff.

## 2021-02-14 NOTE — Anesthesia Procedure Notes (Addendum)
Procedure Name: MAC Date/Time: 02/14/2021 7:38 AM Performed by: Justice Rocher, CRNA Pre-anesthesia Checklist: Timeout performed, Patient being monitored, Suction available, Emergency Drugs available and Patient identified Patient Re-evaluated:Patient Re-evaluated prior to induction Oxygen Delivery Method: Simple face mask Preoxygenation: Pre-oxygenation with 100% oxygen Induction Type: IV induction Placement Confirmation: positive ETCO2, CO2 detector and breath sounds checked- equal and bilateral

## 2021-02-15 ENCOUNTER — Encounter (HOSPITAL_BASED_OUTPATIENT_CLINIC_OR_DEPARTMENT_OTHER): Payer: Self-pay | Admitting: General Surgery

## 2021-02-19 LAB — SURGICAL PATHOLOGY

## 2021-07-09 ENCOUNTER — Ambulatory Visit: Payer: Medicare HMO | Admitting: Obstetrics and Gynecology

## 2021-07-09 ENCOUNTER — Other Ambulatory Visit: Payer: Self-pay

## 2021-07-09 ENCOUNTER — Encounter: Payer: Self-pay | Admitting: Obstetrics and Gynecology

## 2021-07-09 ENCOUNTER — Other Ambulatory Visit (HOSPITAL_COMMUNITY)
Admission: RE | Admit: 2021-07-09 | Discharge: 2021-07-09 | Disposition: A | Discharge status: Home / Self Care | Payer: Medicare HMO | Source: Ambulatory Visit | Attending: Obstetrics and Gynecology | Admitting: Obstetrics and Gynecology

## 2021-07-09 VITALS — BP 122/70 | Ht 61.0 in | Wt 183.0 lb

## 2021-07-09 DIAGNOSIS — N950 Postmenopausal bleeding: Secondary | ICD-10-CM

## 2021-07-09 DIAGNOSIS — Z124 Encounter for screening for malignant neoplasm of cervix: Secondary | ICD-10-CM | POA: Insufficient documentation

## 2021-07-09 DIAGNOSIS — Z1151 Encounter for screening for human papillomavirus (HPV): Secondary | ICD-10-CM | POA: Insufficient documentation

## 2021-07-09 NOTE — Progress Notes (Signed)
Patient ID: Mackenzie Bell, female   DOB: Dec 21, 1955, 66 y.o.   MRN: [id number]  Reason for Consult: Gynecologic Exam   Referred by Donnie Coffin, MD  Subjective:     HPI:  Mackenzie Bell is a 66 y.o. female she is here today for complaints regarding uterine prolapse.  She reports that in October she noticed a a bulge between her legs.  She was in the shower when she fell the uterus fall down.  She then went to [place] and would noticed some blood on her underwear.  She occasionally sees blood when she wipes.  She denies any difficulties with urination or defecation.  She reports that her vaginal bleeding is not daily and that it is less than 50% of the days.  Gynecological History  No LMP recorded. Patient is postmenopausal.   History of fibroids, polyps, or ovarian cysts? : no  History of PCOS? no Hstory of Endometriosis? no History of abnormal pap smears? no Have you had any sexually transmitted infections in the past? no  Last Pap: unknown  She identifies as a female.    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 No pertinent family history. Past Surgical History:  Procedure Laterality Date   COLONOSCOPY WITH PROPOFOL N/A 10/08/2020   Procedure: COLONOSCOPY WITH PROPOFOL;  Surgeon: Lin Landsman, MD;  Location: [facility] END[facility]COPY;  Service: Gastroenterology;  Laterality: N/A;   MASS EXCISION N/A 02/14/2021   Procedure: EXCISIONAL BIOPSY OF ANAL CANAL LESION;  Surgeon: Leighton Ruff, MD;  Location: [place];  Service: General;  Laterality: N/A;   RECTAL EXAM UNDER ANESTHESIA N/A 02/14/2021   Procedure: RECTAL EXAM UNDER ANESTHESIA;  Surgeon: Leighton Ruff, MD;  Location: [place];  Service: General;  Laterality: N/A;    Short Social History:  Social History   Tobacco Use   Smoking status: Never   Smokeless tobacco: Never   Substance Use Topics   Alcohol use: Not on file    Comment: occasional beer    Allergies  Allergen Reactions   Penicillin G Itching    Hand swollen  and itching feet years ago    Current Outpatient Medications  Medication Sig Dispense Refill   acetaminophen (TYLENOL) 500 MG tablet Take 500 mg by mouth every 6 (six) hours as needed. (Patient not taking: Reported on 07/09/2021)     traMADol (ULTRAM) 50 MG tablet Take 1 tablet (50 mg total) by mouth every 6 (six) hours as needed. (Patient not taking: Reported on 07/09/2021) 10 tablet 0   No current facility-administered medications for this visit.    Review of Systems  Constitutional: Negative for chills, fatigue, fever and unexpected weight change.  HENT: Negative for trouble swallowing.  Eyes: Negative for loss of vision.  Respiratory: Negative for cough, shortness of breath and wheezing.  Cardiovascular: Negative for chest pain, leg swelling, palpitations and syncope.  GI: Negative for abdominal pain, blood in stool, diarrhea, nausea and vomiting.  GU: Negative for difficulty urinating, dysuria, frequency and hematuria.  Musculoskeletal: Negative for back pain, leg pain and joint pain.  Skin: Negative for rash.  Neurological: Negative for dizziness, headaches, light-headedness, numbness and seizures.  Psychiatric: Negative for behavioral problem, confusion, depressed mood and sleep disturbance.       Objective:  Objective   Vitals:   07/09/21 1347  BP: 122/70  Weight: 183 lb (83 kg)  Height: 5\' 1"  (1.549 m)   Body mass index is 34.58 kg/m.  Physical Exam Vitals and nursing note reviewed. Exam conducted with a chaperone present.  Constitutional:      Appearance: Normal appearance. She is well-developed.  HENT:     Head: Normocephalic and atraumatic.  Eyes:     Extraocular Movements: Extraocular movements intact.     Pupils: Pupils are equal, round, and reactive to light.  Cardiovascular:     Rate and Rhythm:  Normal rate and regular rhythm.  Pulmonary:     Effort: Pulmonary effort is normal. No respiratory distress.     Breath sounds: Normal breath sounds.  Abdominal:     General: Abdomen is flat.     Palpations: Abdomen is soft.  Genitourinary:    Comments: External: Normal appearing vulva. No lesions noted.  Speculum examination: Normal appearing cervix. No blood in the vaginal vault. No discharge.    Bimanual examination: Uterus midline, non-tender, normal in size, shape and contour.  No CMT. No adnexal masses. No adnexal tenderness. Pelvis not fixed.  Breast exam: exam not performed Musculoskeletal:        General: No signs of injury.  Skin:    General: Skin is warm and dry.  Neurological:     Mental Status: She is alert and oriented to person, place, and time.  Psychiatric:        Behavior: Behavior normal.        Thought Content: Thought content normal.        Judgment: Judgment normal.    POP-Q exam:      Aa = -1 Ba = 0 C = +3  gH = 6 pb = 3 TVL = 9  Ap = 0 BP = +1 D = +2     Assessment/Plan:    66 year old with stage III uterine prolapse Discussed options with the patient regarding management for prolapse such as pessary versus gynecological surgery.  Patient is interested in pessary management.  For that reason she was fitted with a pessary today.  Size 4 donut pessary was comfortable for the patient to wear but did fall out in the toilet.  We trialed a size 5 however the size 5 donut was uncomfortable for the patient.  We will order a size 4 donut pessary.  Advised the patient not to push or strain with a donut on the toilet or to use a finger to support the donut to prevent it from falling out.  Patient will return for pessary placement.  Given her history of postmenopausal bleeding will order a pelvic ultrasound consider endometrial biopsy with her next visit if indicated, bleeding may be result of uterine prolapse secondary to abrasions of the cervix..  Patient has a  family history of colon cancer and a personal history of anal precancerous lesions.  Pap smear was performed today.  More than 30 minutes were spent face to face with the patient in the room, reviewing the medical record, labs and images, and coordinating care for the patient. The plan of management was discussed in detail and counseling was provided.     Adrian Prows MD [facility], [facility] 07/09/2021 2:41 PM

## 2021-07-09 NOTE — Patient Instructions (Signed)
Hemorragia posmenopáusica °Postmenopausal Bleeding °La hemorragia posmenopáusica es el sangrado que tiene una mujer después de haber entrado en la menopausia. La menopausia es el final de la edad fértil de la mujer. Después de la menopausia, una mujer deja de ovular, por lo que deja de tener períodos menstruales. Por lo tanto, ya no debería tener hemorragias vaginales. °La hemorragia posmenopáusica podría tener varias causas; entre ellas: °Terapia hormonal para la menopausia. °Atrofia del endometrio. Después de la menopausia, los bajos niveles de la hormona estrógeno hacen que la membrana que recubre el útero (endometrio) se afine. Podría tener sangrado mientras el endometrio se afina. °Hiperplasia de endometrio. Esta afección es consecuencia de un exceso de la hormona estrógeno y de bajos niveles de la hormona progesterona. El exceso de estrógeno hace que el endometrio se engrose, lo que puede provocar sangrado. En algunos casos, esto puede provocar cáncer de útero. °Cáncer de endometrio. °Crecimientos no cancerosos (pólipos) en el endometrio, el recubrimiento del útero, o en el cuello uterino. °Fibromas uterinos. Estos son crecimientos no cancerosos dentro o alrededor del tejido muscular del útero, que pueden provocar un sangrado abundante. °Todo tipo de sangrado posmenopáusico, incluso si parece ser un período menstrual normal, debe ser evaluado por su médico. El tratamiento dependerá de la causa del sangrado. °Siga estas instrucciones en su casa: ° °Esté atento a cualquier cambio en los síntomas. Informe a su médico acerca de sus síntomas. °Evite las duchas vaginales y el uso de tampones como se lo haya indicado el médico. °Cámbiese las toallas higiénicas de forma regular. °Realícese exámenes pélvicos periódicos, que incluyen pruebas de Papanicolaou, con la frecuencia que le indique el médico. °Tome los suplementos de hierro como se lo haya indicado el médico. °Use los medicamentos de venta libre y los recetados  solamente como se lo haya indicado el médico. °Cumpla con todas las visitas de seguimiento. Esto es importante. °Comuníquese con un médico si: °Tiene un nuevo sangrado de la vagina después de la menopausia. °Tiene dolor en el abdomen. °Solicite ayuda de inmediato si: °Tiene fiebre o escalofríos. °Tiene dolor intenso con el sangrado. °Elimina coágulos de sangre. °Tiene un sangrado abundante, necesita más de 1 toalla higiénica por hora y nunca presentó esto antes. °Tiene dolores de cabeza o se siente débil o mareada. °Resumen °La hemorragia posmenopáusica es el sangrado que tiene una mujer después de haber entrado en la menopausia. °El sangrado posmenopáusico podría tener varias causas. El tratamiento dependerá de la causa del sangrado. °Todo tipo de sangrado posmenopáusico, incluso si parece ser un período menstrual normal, debe ser evaluado por su médico. °Asegúrese de estar atenta a cualquier cambio en los síntomas y concurrir a todas las visitas de control. °Esta información no tiene como fin reemplazar el consejo del médico. Asegúrese de hacerle al médico cualquier pregunta que tenga. °Document Revised: 01/17/2020 Document Reviewed: 01/17/2020 °Elsevier Patient Education © 2022 Elsevier Inc. ° °

## 2021-07-15 LAB — CYTOLOGY - PAP
Comment: NEGATIVE
Diagnosis: NEGATIVE
Diagnosis: REACTIVE
High risk HPV: NEGATIVE

## 2021-07-26 ENCOUNTER — Ambulatory Visit
Admission: RE | Admit: 2021-07-26 | Discharge: 2021-07-26 | Disposition: A | Discharge status: Home / Self Care | Payer: Medicare HMO | Source: Ambulatory Visit | Attending: Obstetrics and Gynecology | Admitting: Obstetrics and Gynecology

## 2021-07-26 DIAGNOSIS — N950 Postmenopausal bleeding: Secondary | ICD-10-CM | POA: Insufficient documentation

## 2021-08-29 ENCOUNTER — Encounter: Payer: Self-pay | Admitting: Obstetrics and Gynecology

## 2021-08-29 ENCOUNTER — Other Ambulatory Visit (HOSPITAL_COMMUNITY)
Admission: RE | Admit: 2021-08-29 | Discharge: 2021-08-29 | Disposition: A | Discharge status: Home / Self Care | Payer: Medicare HMO | Source: Ambulatory Visit | Attending: Obstetrics and Gynecology | Admitting: Obstetrics and Gynecology

## 2021-08-29 ENCOUNTER — Ambulatory Visit: Payer: Medicare HMO | Admitting: Obstetrics and Gynecology

## 2021-08-29 ENCOUNTER — Other Ambulatory Visit: Payer: Self-pay

## 2021-08-29 VITALS — BP 114/72 | Wt 184.2 lb

## 2021-08-29 DIAGNOSIS — N950 Postmenopausal bleeding: Secondary | ICD-10-CM | POA: Insufficient documentation

## 2021-08-29 DIAGNOSIS — N814 Uterovaginal prolapse, unspecified: Secondary | ICD-10-CM

## 2021-08-29 NOTE — Progress Notes (Signed)
? ? ?  Patient ID: Mackenzie Bell, female   DOB: 10-Oct-1955, 66 y.o.   MRN: [id number] ? ?Reason for Consult: Routine Prenatal Visit and pessary insertion ?  ?Referred by Sandrea Hughs, NP ? ?Subjective:  ?   ?HPI: ? ?Matisse Avitia is a 66 y.o. female she is here today for pessary placement and endometrial biopsy.  Her complaints of postmenopausal bleeding and prolapse were previously discussed in office and can be found in my prior note.  She has no new complaints today. ? ?Endometrial Biopsy ?After discussion with the patient regarding her postmenopausal bleeding and thickened endometrium of 9 mm I recommended that she proceed with an endometrial biopsy for further diagnosis. The risks, benefits, alternatives, and indications for an endometrial biopsy were discussed with the patient in detail. She understood the risks including infection, bleeding, cervical laceration and uterine perforation.  Verbal consent was obtained.  ? ?PROCEDURE NOTE:  Pipelle endometrial biopsy was performed using aseptic technique with iodine preparation.  ?The uterus was sounded to a length of 8 cm.  Scant sampling with multiple passes obtained with minimal blood loss.  The patient tolerated the procedure well.  Disposition will be pending pathology. ? ?Pessary Fitting ?Patient presents for a pessary fitting. She desires a pessary as her means of controlling her symptoms of prolapse and/or urinary incontinence. She understands the care needed for a pessary and desires to proceed. Alternative treatment options have been discussed at length and the patient voices an understanding of each option.  ? ?PROCEDURE: The patient was placed in dorsal lithotomy position. Examination confirmed prolapse. A 4 donut pessary was fitted without difficulty. The patient subsequently ambulated, voided and performed valsalva maneuvers without dislodging the pessary and without discomfort. Care instructions were provided. Patient was discharged to home  in stable condition.  ? ?Plan to follow-up in 3 months for pessary cleaning. ? ?More than 20 minutes were spent face to face with the patient in the room, reviewing the medical record, labs and images, and coordinating care for the patient. The plan of management was discussed in detail and counseling was provided.  ? ? ?Adelene Idler MD, FACOG ?[facility], [facility] ?08/29/2021 ?3:14 PM ? ?

## 2021-09-02 LAB — SURGICAL PATHOLOGY

## 2021-09-04 ENCOUNTER — Encounter: Payer: Self-pay | Admitting: Obstetrics and Gynecology

## 2021-11-18 ENCOUNTER — Other Ambulatory Visit: Payer: Self-pay | Admitting: Primary Care

## 2021-11-18 DIAGNOSIS — Z1231 Encounter for screening mammogram for malignant neoplasm of breast: Secondary | ICD-10-CM

## 2021-11-26 ENCOUNTER — Ambulatory Visit: Payer: Medicare HMO | Admitting: Obstetrics

## 2021-11-26 ENCOUNTER — Encounter: Payer: Self-pay | Admitting: Obstetrics

## 2021-11-26 VITALS — BP 138/76 | Wt 183.0 lb

## 2021-11-26 DIAGNOSIS — Z4689 Encounter for fitting and adjustment of other specified devices: Secondary | ICD-10-CM

## 2021-11-26 DIAGNOSIS — N813 Complete uterovaginal prolapse: Secondary | ICD-10-CM

## 2021-11-26 LAB — POCT URINALYSIS DIPSTICK
Bilirubin, UA: NEGATIVE
Glucose, UA: NEGATIVE
Ketones, UA: NEGATIVE
Leukocytes, UA: NEGATIVE
Nitrite, UA: NEGATIVE
Protein, UA: NEGATIVE
Spec Grav, UA: 1.01 (ref 1.010–1.025)
Urobilinogen, UA: 0.2 E.U./dL
pH, UA: 5 (ref 5.0–8.0)

## 2021-11-26 NOTE — Patient Instructions (Signed)
Have a great year! Please call with any concerns. Don't forget to wear your seatbelt everyday! If you are not signed up on MyChart, please ask [place] how to sign up for it!   In a world where you can be anything, please be kind.   Body mass index is 34.58 kg/m.  A Healthy Lifestyle: Care Instructions Your Care Instructions  A healthy lifestyle can help you feel good, stay at a healthy weight, and have plenty of energy for both work and play. A healthy lifestyle is something you can share with your whole family. A healthy lifestyle also can lower your risk for serious health problems, such as high blood pressure, heart disease, and diabetes. You can follow a few steps listed below to improve your health and the health of your family. Follow-up care is a key part of your treatment and safety. Be sure to make and go to all appointments, and call your doctor if you are having problems. It's also a good idea to know your test results and keep a list of the medicines you take. How can you care for yourself at home? Do not eat too much sugar, fat, or fast foods. You can still have dessert and treats now and then. The goal is moderation. Start small to improve your eating habits. Pay attention to portion sizes, drink less juice and soda pop, and eat more fruits and vegetables. Eat a healthy amount of food. A 3-ounce serving of meat, for example, is about the size of a deck of cards. Fill the rest of your plate with vegetables and whole grains. Limit the amount of soda and sports drinks you have every day. Drink more water when you are thirsty. Eat at least 5 servings of fruits and vegetables every day. It may seem like a lot, but it is not hard to reach this goal. A serving or helping is 1 piece of fruit, 1 cup of vegetables, or 2 cups of leafy, raw vegetables. Have an apple or some carrot sticks as an afternoon snack instead of a candy bar. Try to have fruits and/or vegetables at every meal. Make exercise  part of your daily routine. You may want to start with simple activities, such as walking, bicycling, or slow swimming. Try to be active 30 to 60 minutes every day. You do not need to do all 30 to 60 minutes all at once. For example, you can exercise 3 times a day for 10 or 20 minutes. Moderate exercise is safe for most people, but it is always a good idea to talk to your doctor before starting an exercise program. Keep moving. Mow the lawn, work in the garden, or [employer]. Take the stairs instead of the elevator at work. If you smoke, quit. People who smoke have an increased risk for heart attack, stroke, cancer, and other lung illnesses. Quitting is hard, but there are ways to boost your chance of quitting tobacco for good. Use nicotine gum, patches, or lozenges. Ask your doctor about stop-smoking programs and medicines. Keep trying. In addition to reducing your risk of diseases in the future, you will notice some benefits soon after you stop using tobacco. If you have shortness of breath or asthma symptoms, they will likely get better within a few weeks after you quit. Limit how much alcohol you drink. Moderate amounts of alcohol (up to 2 drinks a day for men, 1 drink a day for women) are okay. But drinking too much can lead to  liver problems, high blood pressure, and other health problems. Family health If you have a family, there are many things you can do together to improve your health. Eat meals together as a family as often as possible. Eat healthy foods. This includes fruits, vegetables, lean meats and dairy, and whole grains. Include your family in your fitness plan. Most people think of activities such as jogging or tennis as the way to fitness, but there are many ways you and your family can be more active. Anything that makes you breathe hard and gets your heart pumping is exercise. Here are some tips: Walk to do errands or to take your child to school or the bus. Go for a family  bike ride after dinner instead of watching TV. Care instructions adapted under license by your healthcare professional. This care instruction is for use with your licensed healthcare professional. If you have questions about a medical condition or this instruction, always ask your healthcare professional. [place] any warranty or liability for your use of this information.

## 2021-11-26 NOTE — Progress Notes (Unsigned)
No chief complaint on file.  Mackenzie Bell is a 66 y.o. G3P0 No LMP recorded. Patient is postmenopausal. who presents for pessary maintenance. She was fitted for a #4 donut and states it has been falling out. She has been sick. She has been coughing. She is on z pack and uses tessalon pearles.   Patient denies postmenopausal bleeding. Patient {is/not/never:19231} sexually active. Patient denies vaginal discharge, odor, burning or itching. Patient denies urinary symptoms.   Physical Exam: There were no vitals taken for this visit. There is no height or weight on file to calculate BMI. Physical Exam  Assessment and Plan: No diagnosis found.  UA neg*** Patient stated she had no discomfort and was able to urinate before leaving without difficulty.*** Continue pessary #*** Ring with support  Return to office every 3 months for pessary cleaning and vaginal exam

## 2021-12-19 ENCOUNTER — Ambulatory Visit
Admission: RE | Admit: 2021-12-19 | Discharge: 2021-12-19 | Disposition: A | Discharge status: Home / Self Care | Payer: Medicare HMO | Source: Ambulatory Visit | Attending: Primary Care | Admitting: Primary Care

## 2021-12-19 DIAGNOSIS — Z1231 Encounter for screening mammogram for malignant neoplasm of breast: Secondary | ICD-10-CM | POA: Diagnosis present

## 2023-02-12 ENCOUNTER — Encounter: Payer: Self-pay | Admitting: Obstetrics & Gynecology

## 2023-03-06 ENCOUNTER — Ambulatory Visit: Payer: Medicare HMO | Admitting: Obstetrics & Gynecology

## 2023-03-06 ENCOUNTER — Encounter: Payer: Self-pay | Admitting: Obstetrics & Gynecology

## 2023-03-06 VITALS — BP 134/56 | Wt 163.0 lb

## 2023-03-06 DIAGNOSIS — N813 Complete uterovaginal prolapse: Secondary | ICD-10-CM | POA: Diagnosis not present

## 2023-03-06 DIAGNOSIS — N814 Uterovaginal prolapse, unspecified: Secondary | ICD-10-CM | POA: Insufficient documentation

## 2023-03-06 DIAGNOSIS — Z603 Acculturation difficulty: Secondary | ICD-10-CM | POA: Diagnosis not present

## 2023-03-06 DIAGNOSIS — Z758 Other problems related to medical facilities and other health care: Secondary | ICD-10-CM

## 2023-03-06 NOTE — Progress Notes (Signed)
    GYNECOLOGY PROGRESS NOTE  Subjective:    Patient ID: Mackenzie Bell, female    DOB: 11-08-1955, 67 y.o.   MRN: [id number]  HPI  Patient is a 67 y.o. G3P0  who presents for pessary maintenance. She had a #4 donut placed 10/2021 and has not been back since. She says that she was not instructed to come back. She report that she started having pain and some spotting about 3 months ago.   The following portions of the patient's history were reviewed and updated as appropriate: allergies, current medications, past family history, past medical history, past social history, past surgical history, and problem list.  Review of Systems Pertinent items are noted in HPI.   Objective:   Blood pressure (!) 134/56, weight 163 lb (73.9 kg). Body mass index is 30.8 kg/m. Well nourished, well hydrated Latina, no apparent distress  She speaks Spanish and we had an interpretor present for the visit.  I removed the #4 donut with a fair amount of difficulty, very tight fit. I examined her vault with a Graves speculum.  There was an erosion on the posterior vaginal wall just beneath the cervix. It was not especially deep and had only a small amount of blood noted.  Assessment:   1. Uterine procidentia      Plan:   1. Uterine procidentia She wants a referral to a urogyn for discussion about surgery. In the meantime, I have ordered a #3 donut. She will be notified when it has arrived and I will place it and give it a trial.

## 2023-03-31 ENCOUNTER — Encounter: Payer: Self-pay | Admitting: Obstetrics & Gynecology

## 2023-03-31 ENCOUNTER — Ambulatory Visit: Payer: Medicare HMO | Admitting: Obstetrics & Gynecology

## 2023-03-31 VITALS — BP 128/57 | HR 68 | Wt 164.0 lb

## 2023-03-31 DIAGNOSIS — Z4689 Encounter for fitting and adjustment of other specified devices: Secondary | ICD-10-CM | POA: Diagnosis not present

## 2023-03-31 DIAGNOSIS — Z758 Other problems related to medical facilities and other health care: Secondary | ICD-10-CM

## 2023-03-31 DIAGNOSIS — Z603 Acculturation difficulty: Secondary | ICD-10-CM | POA: Diagnosis not present

## 2023-03-31 DIAGNOSIS — N813 Complete uterovaginal prolapse: Secondary | ICD-10-CM | POA: Diagnosis not present

## 2023-03-31 NOTE — Progress Notes (Signed)
    GYNECOLOGY PROGRESS NOTE  Subjective:    Patient ID: Mackenzie Bell, female    DOB: [birth year], 67 y.o.   MRN: [id number]  HPI  Patient is a 67 y.o. G3P0 here for her new pessary. I had noted vaginal excoriations with her #4 donut pessary. I fitted her with a #3 (70 mm) donut and it has arrived.   She will be going to [place] from 04/30/2023 to 11/31/2024.   The following portions of the patient's history were reviewed and updated as appropriate: allergies, current medications, past family history, past medical history, past social history, past surgical history, and problem list.  Review of Systems Pertinent items are noted in HPI.   Objective:   Blood pressure (!) 128/57, pulse 68, weight 164 lb (74.4 kg). Body mass index is 30.99 kg/m. Well nourished, well hydrated Latina, no apparent distress  Live interpretor used for the exam. I placed the #4 donut after examining her vagina with a Graves speculum. The excoriations are healed. She reports that the #4 relieves her prolapse and does not cause her any pain.  Assessment:   Uterine procidentia  Plan:   #4 donut She will come back in 3 months for pessary cleaning or prn sooner

## 2023-04-06 ENCOUNTER — Ambulatory Visit: Payer: Medicare HMO | Admitting: Obstetrics & Gynecology

## 2023-04-06 VITALS — BP 123/67 | HR 67 | Wt 165.0 lb

## 2023-04-06 DIAGNOSIS — N813 Complete uterovaginal prolapse: Secondary | ICD-10-CM

## 2023-04-06 NOTE — Progress Notes (Signed)
    GYNECOLOGY PROGRESS NOTE  Subjective:    Patient ID: Mackenzie Bell, female    DOB: 08/14/1955, 67 y.o.   MRN: [id number]  HPI  Patient is a 67 y.o. G39P0 female who is here because her new #3 donut pessary fell out 1 day after it was put in. The #4 was painful for her and caused vaginal excoriations.  She is sexually active.  Review of Systems Pertinent items are noted in HPI.   Objective:   Blood pressure 123/67, pulse 67, weight 165 lb (74.8 kg). Body mass index is 31.18 kg/m. Well nourished, well hydrated Latina, no apparent distress  Live interpretor present for exam. I fitted her with a #4 cube pessary.  This worked well for her.   Assessment:   Uterine procedenitia-  An order will be placed for a #4 cube and she will return when it arrives.  Plan:   There are no diagnoses linked to this encounter.

## 2023-04-20 ENCOUNTER — Ambulatory Visit: Payer: Medicare HMO | Admitting: Obstetrics & Gynecology

## 2023-04-20 ENCOUNTER — Encounter: Payer: Self-pay | Admitting: Obstetrics & Gynecology

## 2023-04-20 VITALS — BP 122/76 | HR 62 | Ht 61.0 in | Wt 166.6 lb

## 2023-04-20 DIAGNOSIS — Z4689 Encounter for fitting and adjustment of other specified devices: Secondary | ICD-10-CM | POA: Diagnosis not present

## 2023-04-20 DIAGNOSIS — Z603 Acculturation difficulty: Secondary | ICD-10-CM | POA: Diagnosis not present

## 2023-04-20 DIAGNOSIS — Z758 Other problems related to medical facilities and other health care: Secondary | ICD-10-CM

## 2023-04-20 DIAGNOSIS — N814 Uterovaginal prolapse, unspecified: Secondary | ICD-10-CM

## 2023-04-20 NOTE — Progress Notes (Signed)
    GYNECOLOGY PROGRESS NOTE  Subjective:    Patient ID: Mackenzie Bell, female    DOB: 1956-01-06, 67 y.o.   MRN: [id number]  HPI  Patient is a 67 y.o. G48P0 female who is here because her new #3 donut pessary fell out 1 day after it was put in. The #4 was painful for her and caused vaginal excoriations. I fitted her with a #4 cube and it has now arrived.  She says that the current #3 donut has stayed in place since her last visit and it is relieving her symptoms and not causing her any discomfort. She would like to continue to use this one instead of changing to the cube at this point.  She is going to visit [place] for a month, leaving next week. Her niece there is a doctor and can manage her pessary if necessary.  The following portions of the patient's history were reviewed and updated as appropriate: allergies, current medications, past family history, past medical history, past social history, past surgical history, and problem list.  Review of Systems Pertinent items are noted in HPI.   Objective:   Blood pressure 122/76, pulse 62, height 5\' 1"  (1.549 m), weight 166 lb 9.6 oz (75.6 kg). Body mass index is 31.48 kg/m. Well nourished, well hydrated Latina, no apparent distress  IPad interpretor present for exam I examined her and found the donut pessary in proper position.   Assessment:   1. Pessary maintenance   2. Language barrier affecting health care   3. Uterine prolapse      Plan:   1. Pessary maintenance - continue #3 donut at this time - She will plan on pessary maintenance in about 4 months/prn sooner  2. Language barrier affecting health care   3. Uterine prolapse

## 2023-04-21 IMAGING — MG MM DIGITAL SCREENING BILAT W/ TOMO AND CAD
8 series · 8 of 24 positions shown · non-contrast
Comparison: None available.

CLINICAL DATA: Screening.

EXAM:
DIGITAL SCREENING BILATERAL MAMMOGRAM WITH TOMOSYNTHESIS AND CAD
TECHNIQUE: Bilateral screening digital craniocaudal and mediolateral oblique
mammograms were obtained. Bilateral screening digital breast
tomosynthesis was performed. The images were evaluated with
computer-aided detection.

[L MLO synth-2D]
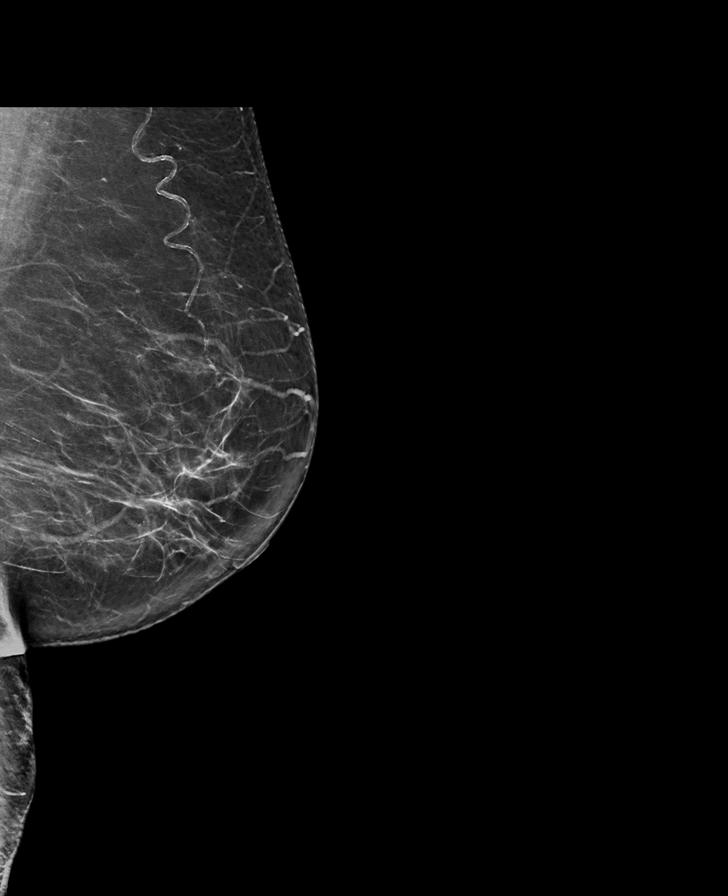

[L CC synth-2D]
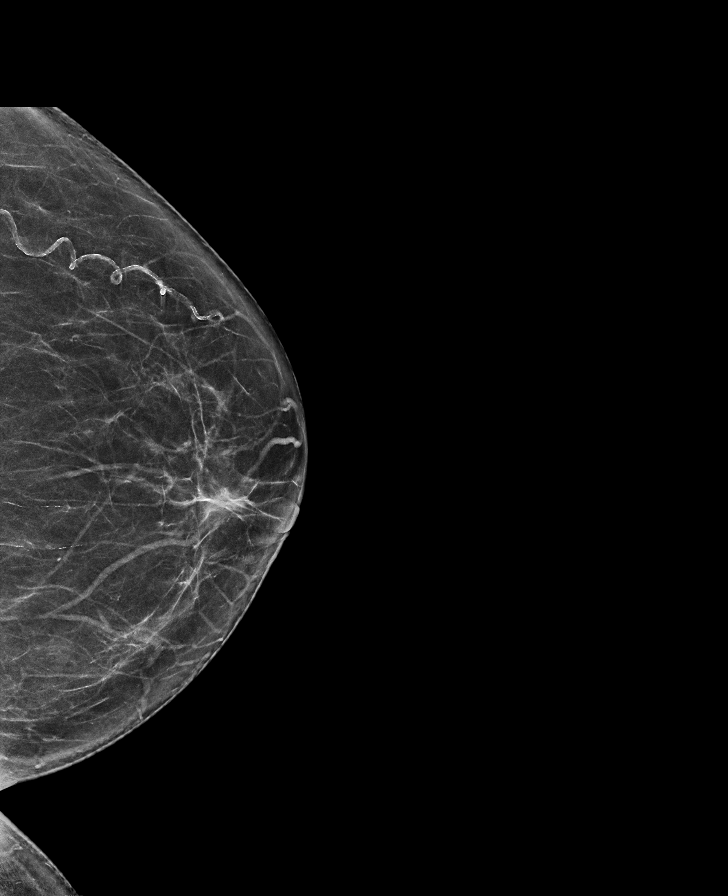

[R CC synth-2D]
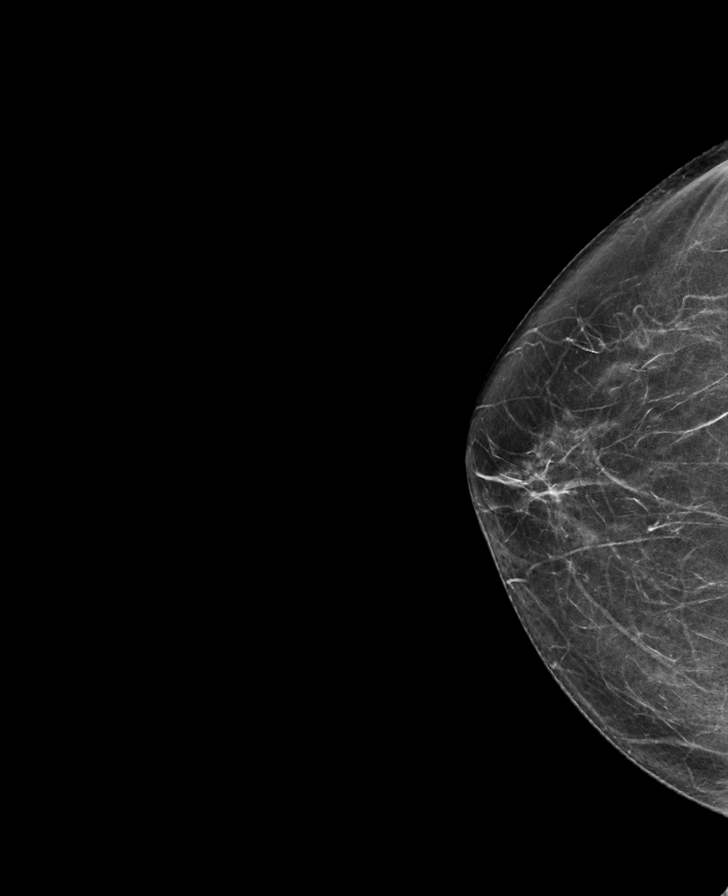

[R MLO synth-2D]
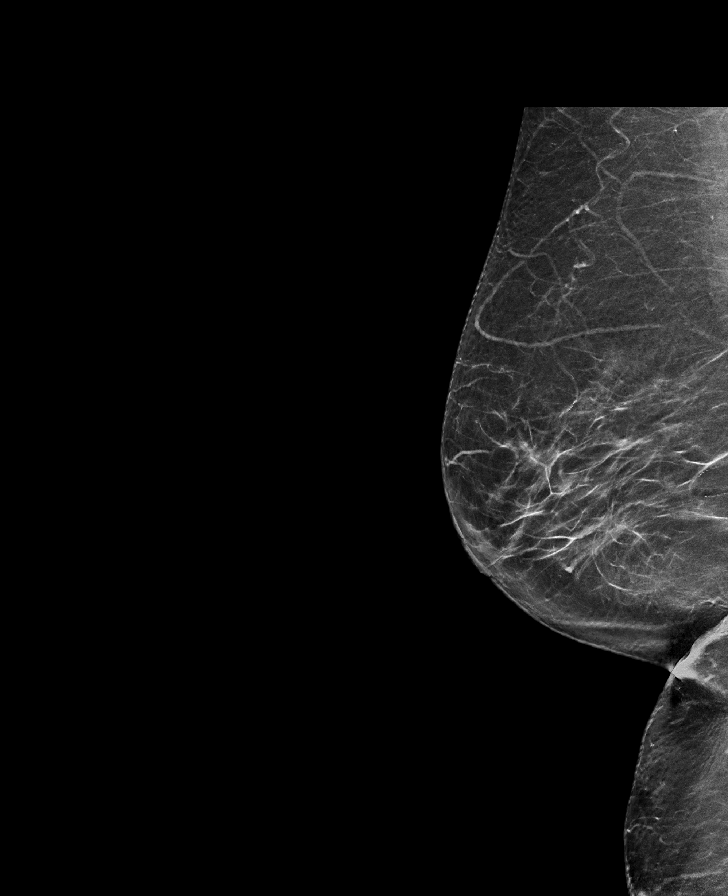

[R CC tomo · tomo slice 35/69.0]
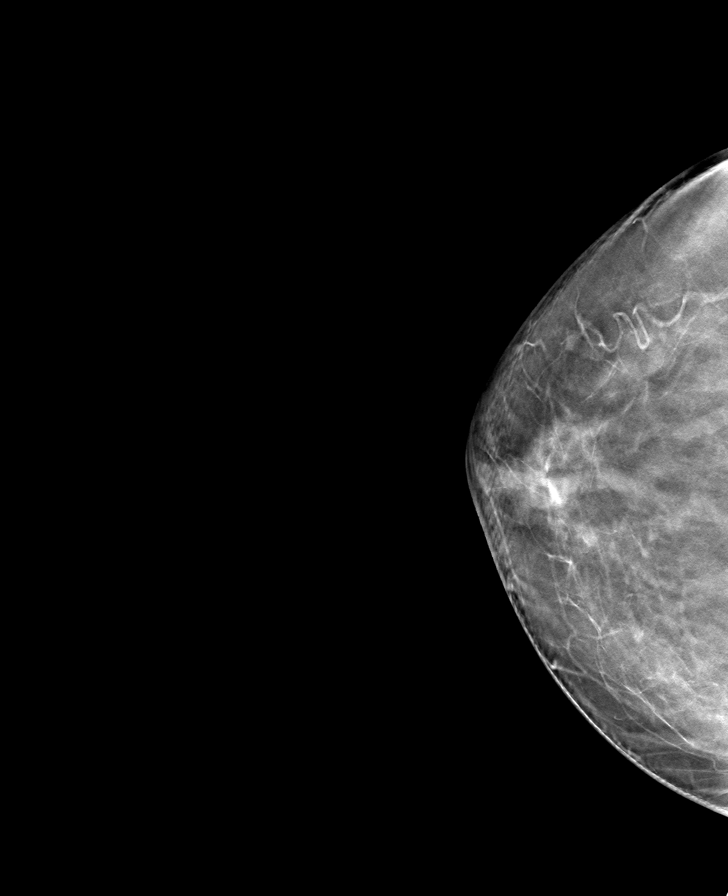

[R MLO tomo · tomo slice 37/73.0]
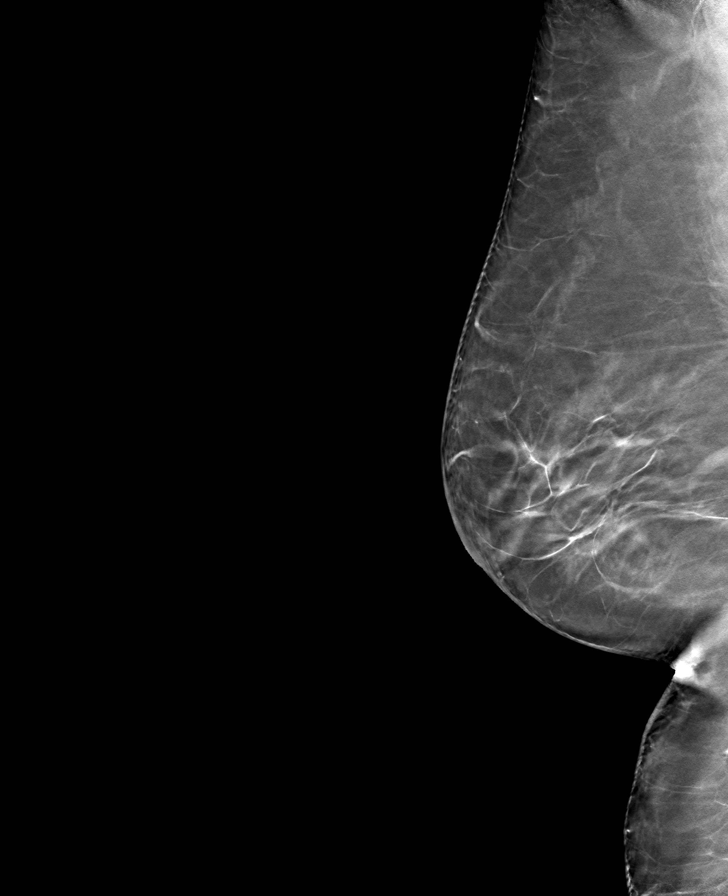

[L MLO tomo · tomo slice 39/77.0]
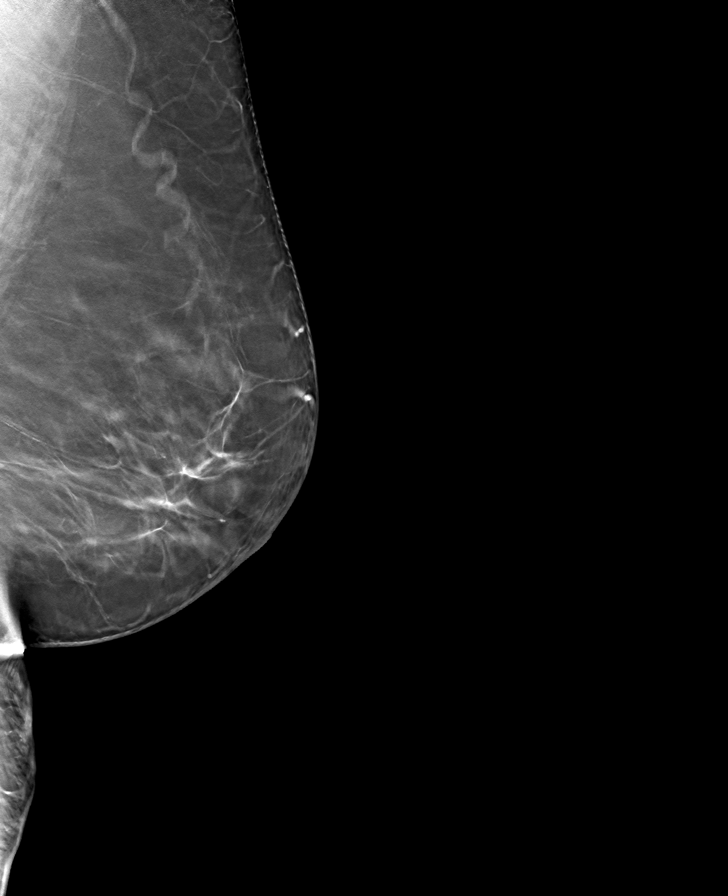

[L CC tomo · tomo slice 35/68.0]
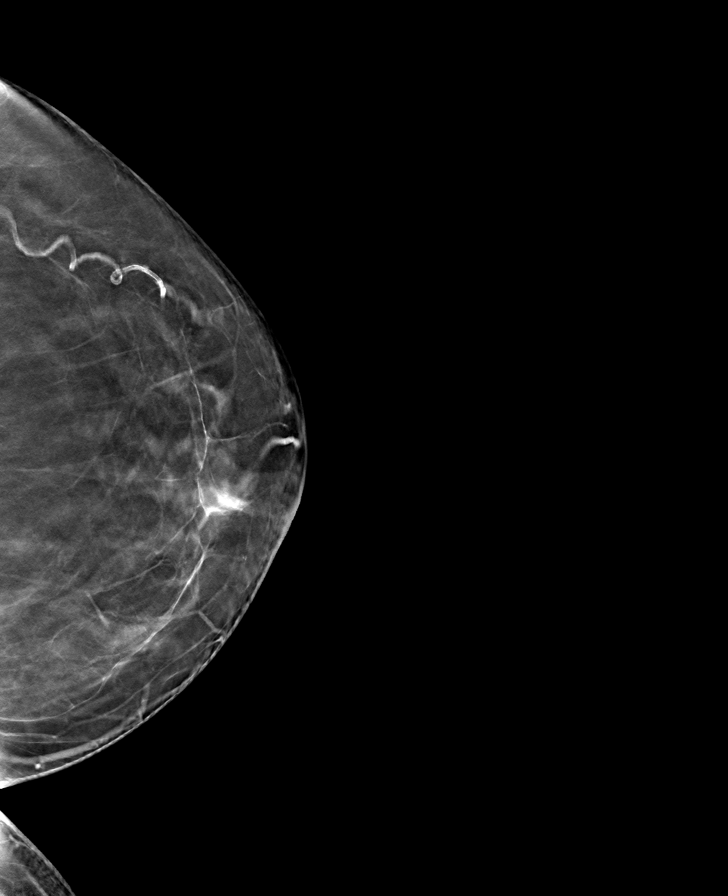

[8 of 24 positions shown; findings below may reference images not displayed]

ACR Breast Density Category b: There are scattered areas of
fibroglandular density.
FINDINGS: There are no findings suspicious for malignancy.
IMPRESSION: No mammographic evidence of malignancy. A result letter of this
screening mammogram will be mailed directly to the patient.

RECOMMENDATION:
Screening mammogram in one year. (Code:GB-Z-FIU)

BI-RADS CATEGORY  1: Negative.

## 2023-06-16 ENCOUNTER — Ambulatory Visit: Payer: Medicare HMO | Admitting: Obstetrics

## 2023-06-16 NOTE — Progress Notes (Deleted)
New Patient Evaluation and Consultation  Referring Provider: Allie Bossier, MD PCP: Sandrea Hughs, NP Date of Service: 06/16/2023  SUBJECTIVE Chief Complaint: No chief complaint on file.  History of Present Illness: Mackenzie Bell is a 67 y.o. {ED SANE BJYN:82956} female seen in consultation at the request of {Dr, PA, OZ:30865} Dove for evaluation of uterine procidentia.    Using size 3 donut pessary placed by Dr. Marice Potter ***Review of records significant for: ***History of AIN  Urinary Symptoms: {urine leakage?:24754} Leaks *** time(s) per {days/wks/mos/yrs:310907}.  Pad use: {NUMBERS 1-10:18281} {pad option:24752} per day.   Patient {ACTION; IS/IS HQI:69629528} bothered by UI symptoms.  Day time voids ***.  Nocturia: *** times per night to void. Voiding dysfunction:  {empties:24755} bladder well.  Patient {DOES NOT does:27190::"does not"} use a catheter to empty bladder.  When urinating, patient feels {urine symptoms:24756} Drinks: *** per day  UTIs: {NUMBERS 1-10:18281} UTI's in the last year.   {ACTIONS;DENIES/REPORTS:21021675::"Denies"} history of {urologic concerns:24757} No results found for the last 90 days.   Pelvic Organ Prolapse Symptoms:                  Patient {denies/ admits to:24761} a feeling of a bulge the vaginal area. It has been present for {NUMBER 1-10:22536} {days/wks/mos/yrs:310907}.  Patient {denies/ admits to:24761} seeing a bulge.  This bulge {ACTION; IS/IS UXL:24401027} bothersome.  Bowel Symptom: Bowel movements: *** time(s) per {Time; day/week/month:13537} Stool consistency: {stool consistency:24758} Straining: {yes/no:19897}.  Splinting: {yes/no:19897}.  Incomplete evacuation: {yes/no:19897}.  Patient {denies/ admits to:24761} accidental bowel leakage / fecal incontinence  Occurs: *** time(s) per {Time; day/week/month:13537}  Consistency with leakage: {stool consistency:24758} Bowel regimen: {bowel regimen:24759} Last colonoscopy: Date  ***, Results *** [facility] Colonoscopy          Upcoming     Colonoscopy (Every 7 Years) Next due on 10/09/2027    10/08/2020  COLONOSCOPY   Only the first 1 history entries have been loaded, but more history exists.                Sexual Function Sexually active: {yes/no:19897}.  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   Headache      Past Surgical History:   Past Surgical History:  Procedure Laterality Date   COLONOSCOPY WITH PROPOFOL N/A 10/08/2020   Procedure: COLONOSCOPY WITH PROPOFOL;  Surgeon: Toney Reil, MD;  Location: [facility];  Service: Gastroenterology;  Laterality: N/A;   MASS EXCISION N/A 02/14/2021   Procedure: EXCISIONAL BIOPSY OF ANAL CANAL LESION;  Surgeon: Romie Levee, MD;  Location: [facility];  Service: General;  Laterality: N/A;   RECTAL EXAM UNDER ANESTHESIA N/A 02/14/2021   Procedure: RECTAL EXAM UNDER ANESTHESIA;  Surgeon: Romie Levee, MD;  Location: [facility];  Service: General;  Laterality: N/A;     Past OB/GYN History: OB History  Gravida Para Term Preterm AB Living  3       SAB IAB Ectopic Multiple Live Births          # Outcome Date GA Lbr Len/2nd Weight Sex Type Anes PTL Lv  3 Gravida           2 [place]  1 Gravida             Vaginal deliveries: ***,  Forceps/ Vacuum deliveries: ***, Cesarean section: *** Menopausal: {menopausal:24763} Contraception: ***. Last pap smear was ***.  Any history of abnormal pap smears: {yes/no:19897}.    Component Value Date/Time   DIAGPAP  07/09/2021 1440    - Negative for Intraepithelial Lesions or Malignancy  (NILM)   DIAGPAP - Benign reactive/reparative changes 07/09/2021 1440   HPVHIGH Negative 07/09/2021 1440   ADEQPAP  07/09/2021 1440    Satisfactory but limited for evaluation with partially obscuring blood;   ADEQPAP transformation zone component present. 07/09/2021 1440    Medications: Patient has a current medication list which includes the following prescription(s): acetaminophen.   Allergies: Patient is allergic to penicillin g.   Social History:  Social History   Tobacco Use   Smoking status: Never   Smokeless tobacco: Never  Vaping Use   Vaping status: Never Used  Substance Use Topics   Drug use: Never    Relationship status: {relationship status:24764} Patient lives with ***.   Patient {ACTION; IS/IS ZOX:09604540} employed ***. Regular exercise: {Yes/No:304960894} History of abuse: {Yes/No:304960894}  Family History:   Family History  Problem Relation Age of Onset   Ovarian cancer Maternal Grandmother      Review of Systems: ROS   OBJECTIVE Physical Exam: There were no vitals filed for this visit.  Physical Exam   GU / Detailed Urogynecologic Evaluation:  Pelvic Exam: Normal external female genitalia; Bartholin's and Skene's glands normal in appearance; urethral meatus normal in appearance, no urethral masses or discharge.   CST: {gen negative/positive:315881}  Reflexes: bulbocavernosis {DESC; PRESENT/NOT PRESENT:21021351}, anocutaneous {DESC; PRESENT/NOT PRESENT:21021351} ***bilaterally.  Speculum exam reveals normal vaginal mucosa {With/Without:20273} atrophy. Cervix {exam; gyn cervix:30847}. Uterus {exam; pelvic uterus:30849}. Adnexa {exam; adnexa:12223}.    s/p hysterectomy: Speculum exam reveals normal vaginal mucosa {With/Without:20273}  atrophy and normal vaginal cuff.  Adnexa {exam; adnexa:12223}.    With apex supported, anterior compartment defect was {reduced:24765}  Pelvic floor strength {Roman # I-V:19040}/V, puborectalis {Roman #  I-V:19040}/V external anal sphincter {Roman # I-V:19040}/V  Pelvic floor musculature: Right levator {Tender/Non-tender:20250}, Right obturator {Tender/Non-tender:20250}, Left levator {Tender/Non-tender:20250}, Left obturator {Tender/Non-tender:20250}  POP-Q:   POP-Q                                               Aa                                               Ba                                                 C                                                Gh  Pb                                               tvl                                                Ap                                               Bp                                                 D      Rectal Exam:  Normal sphincter tone, {rectocele:24766} distal rectocele, enterocoele {DESC; PRESENT/NOT PRESENT:21021351}, no rectal masses, {sign of:24767} dyssynergia when asking the patient to bear down.  Post-Void Residual (PVR) by Bladder Scan: In order to evaluate bladder emptying, we discussed obtaining a postvoid residual and patient agreed to this procedure.  Procedure: The ultrasound unit was placed on the patient's abdomen in the suprapubic region after the patient had voided.      Laboratory Results: Lab Results  Component Value Date   COLORU yellow 11/26/2021   CLARITYU clear 11/26/2021   GLUCOSEUR Negative 11/26/2021   BILIRUBINUR negative 11/26/2021   KETONESU negative 11/26/2021   SPECGRAV 1.010 11/26/2021   RBCUR non hemolyzed trace 11/26/2021   PHUR 5.0 11/26/2021   PROTEINUR Negative 11/26/2021   UROBILINOGEN 0.2 11/26/2021   LEUKOCYTESUR Negative 11/26/2021    No results found for: "CREATININE"  No results found for: "HGBA1C"  No results found for: "HGB"   ASSESSMENT AND PLAN Ms. Lawrie is a 67 y.o. with: No diagnosis found.  There are no diagnoses linked to this encounter.   Loleta Chance, MD

## 2023-09-02 ENCOUNTER — Ambulatory Visit: Payer: Medicare HMO | Admitting: Obstetrics

## 2023-09-02 ENCOUNTER — Encounter: Payer: Self-pay | Admitting: Obstetrics and Gynecology

## 2023-09-02 VITALS — BP 129/56 | HR 64 | Ht 60.0 in | Wt 167.0 lb

## 2023-09-02 DIAGNOSIS — N814 Uterovaginal prolapse, unspecified: Secondary | ICD-10-CM

## 2023-09-02 DIAGNOSIS — Z758 Other problems related to medical facilities and other health care: Secondary | ICD-10-CM

## 2023-09-02 DIAGNOSIS — Z4689 Encounter for fitting and adjustment of other specified devices: Secondary | ICD-10-CM | POA: Diagnosis not present

## 2023-09-02 DIAGNOSIS — Z603 Acculturation difficulty: Secondary | ICD-10-CM

## 2023-09-02 MED ORDER — TRIMO-SAN 0.025 % VA GEL: VAGINAL | 2 refills | Status: DC

## 2023-09-02 NOTE — Progress Notes (Unsigned)
  HPI:      Ms. Mackenzie Bell is a 68 y.o. G3P0 who presents today for her pessary follow up and examination related to her pelvic floor weakening.  Pt reports tolerating the pessary well with, Reports  vaginal bleeding and  no vaginal discharge.  Symptoms of pelvic floor weakening have greatly improved. She is voiding and defecating without difficulty. She currently has a #4 cube  pessary.  PMHx: She  has a past medical history of AIN (anal intraepithelial neoplasia) anal canal (02/07/2021) and Headache. Also,  has a past surgical history that includes Colonoscopy with propofol (N/A, 10/08/2020); Mass excision (N/A, 02/14/2021); and Rectal exam under anesthesia (N/A, 02/14/2021)., family history includes Ovarian cancer in her maternal grandmother.,  reports that she has never smoked. She has never used smokeless tobacco. She reports that she does not use drugs.  She has a current medication list which includes the following prescription(s): acetaminophen. Also, is allergic to penicillin g.  ROS  Objective: Ht 5' (1.524 m)   Wt 167 lb (75.8 kg)   BMI 32.61 kg/m  OBGyn Exam  Pessary Care Pessary removed and cleaned.  Vagina checked - without erosions - pessary replaced.  A/P:  ***Pessary was cleaned and replaced today. Instructions given for care. Concerning symptoms to observe for are counseled to patient. Follow up scheduled for 3 months.  Pessary holiday advised.  Will leave out for 2 weeks. Pt is now interested in bladder surgery and is requesting female provider. Will have her return in 2 weeks for a surgical consult with Dr. Valentino Saxon and re-eval of the vaginal ulcer. She has been given her pessary, instructed to bring back for reinsertion if she desires.    Cornelius Moras, CMA  [facility], [facility] 09/02/2023  11:12 AM

## 2023-09-03 ENCOUNTER — Telehealth: Payer: Self-pay

## 2023-09-03 NOTE — Telephone Encounter (Signed)
 Rx is not needed, medication was sent before appointment was cancelled. PA is not needed.

## 2023-09-15 ENCOUNTER — Encounter: Payer: Self-pay | Admitting: Obstetrics and Gynecology

## 2023-09-15 ENCOUNTER — Ambulatory Visit: Admitting: Obstetrics and Gynecology

## 2023-09-15 ENCOUNTER — Ambulatory Visit: Admitting: Obstetrics

## 2023-09-15 VITALS — BP 134/78 | HR 63 | Wt 169.0 lb

## 2023-09-15 DIAGNOSIS — N813 Complete uterovaginal prolapse: Secondary | ICD-10-CM | POA: Diagnosis not present

## 2023-09-15 NOTE — Progress Notes (Signed)
 HPI:      Ms. Latiffany Trotter is a 68 y.o. G3P0 who LMP was No LMP recorded. Patient is postmenopausal.  Subjective:   She presents today because she would like her pessary replaced.  She has been seeing Dr. Lonny Prude for uterine procidentia and pessary management.  She reports she is not using Trimo-San cream or any other vaginal cream. After discussion she understands that I cannot replace the pessary if she continues to have vaginal erosions.  She says that she is still bleeding every day and she is sure they are not completely healed.  Once understanding that pessary could not be replaced if erosions were present she has declined examination. She has expressed interest in definitive surgery for her urine loss and procidentia.    Hx: The following portions of the patient's history were reviewed and updated as appropriate:             She  has a past medical history of AIN (anal intraepithelial neoplasia) anal canal (02/07/2021) and Headache. She does not have any pertinent problems on file. She  has a past surgical history that includes Colonoscopy with propofol (N/A, 10/08/2020); Mass excision (N/A, 02/14/2021); and Rectal exam under anesthesia (N/A, 02/14/2021). Her family history includes Ovarian cancer in her maternal grandmother. She  reports that she has never smoked. She has never used smokeless tobacco. She reports that she does not use drugs. No history on file for alcohol use. She has a current medication list which includes the following prescription(s): acetaminophen. She is allergic to penicillin g.       Review of Systems:  Review of Systems  Constitutional: Denied constitutional symptoms, night sweats, recent illness, fatigue, fever, insomnia and weight loss.  Eyes: Denied eye symptoms, eye pain, photophobia, vision change and visual disturbance.  Ears/Nose/Throat/Neck: Denied ear, nose, throat or neck symptoms, hearing loss, nasal discharge, sinus congestion and sore throat.   Cardiovascular: Denied cardiovascular symptoms, arrhythmia, chest pain/pressure, edema, exercise intolerance, orthopnea and palpitations.  Respiratory: Denied pulmonary symptoms, asthma, pleuritic pain, productive sputum, cough, dyspnea and wheezing.  Gastrointestinal: Denied, gastro-esophageal reflux, melena, nausea and vomiting.  Genitourinary: See HPI for additional information.  Musculoskeletal: Denied musculoskeletal symptoms, stiffness, swelling, muscle weakness and myalgia.  Dermatologic: Denied dermatology symptoms, rash and scar.  Neurologic: Denied neurology symptoms, dizziness, headache, neck pain and syncope.  Psychiatric: Denied psychiatric symptoms, anxiety and depression.  Endocrine: Denied endocrine symptoms including hot flashes and night sweats.   Meds:   Current Outpatient Medications on File Prior to Visit  Medication Sig Dispense Refill   acetaminophen (TYLENOL) 500 MG tablet Take 500 mg by mouth every 6 (six) hours as needed.     No current facility-administered medications on file prior to visit.      Objective:     Vitals:   09/15/23 1442 09/15/23 1444  BP: 137/81 134/78  Pulse: 63    Filed Weights   09/15/23 1442  Weight: 169 lb (76.7 kg)              Patient declined examination today          Assessment:    G3P0 Patient Active Problem List   Diagnosis Date Noted   Uterine prolapse 03/06/2023   Language barrier affecting health care 03/06/2023   Screening for colon cancer      1. Uterine procidentia     Patient has declined examination today because she continues to have vaginal bleeding daily.  She understands that the pessary cannot be  replaced until the erosion has resolved.   Plan:            1.  She will contact us when her bleeding has resolved for pessary replacement.  She has expressed interest in hysterectomy likely bladder sling procedure as well as vaginal suspension.  As she remains sexually active, [place] procedure would  not be appropriate.  Vaginal suspensions are not done in [place]-I would refer to [facility] or [facility].  Patient seems willing to travel for this particular surgery.  I have asked her to schedule follow-up with Dr. Lonny Prude when bleeding has resolved.   Orders No orders of the defined types were placed in this encounter.   No orders of the defined types were placed in this encounter.     F/U  No follow-ups on file.  Elonda Husky, M.D. 09/15/2023 3:15 PM

## 2023-12-17 ENCOUNTER — Other Ambulatory Visit: Payer: Self-pay

## 2023-12-17 DIAGNOSIS — N814 Uterovaginal prolapse, unspecified: Secondary | ICD-10-CM

## 2023-12-17 DIAGNOSIS — N813 Complete uterovaginal prolapse: Secondary | ICD-10-CM

## 2024-01-06 ENCOUNTER — Ambulatory Visit: Admitting: Obstetrics and Gynecology

## 2024-06-27 ENCOUNTER — Other Ambulatory Visit: Payer: Self-pay

## 2024-06-27 DIAGNOSIS — N950 Postmenopausal bleeding: Secondary | ICD-10-CM

## 2024-07-01 ENCOUNTER — Ambulatory Visit
Admission: RE | Admit: 2024-07-01 | Discharge: 2024-07-01 | Disposition: A | Discharge status: Home / Self Care | Source: Ambulatory Visit

## 2024-07-01 DIAGNOSIS — N950 Postmenopausal bleeding: Secondary | ICD-10-CM | POA: Insufficient documentation
